# Patient Record
Sex: Female | Born: 1995 | Hispanic: No | Marital: Single | Smoking: Never smoker
Health system: Southern US, Community
[De-identification: ages and names within clinical notes are randomized; demographics above are authoritative.]

## PROBLEM LIST (undated history)

## (undated) DIAGNOSIS — D649 Anemia, unspecified: Secondary | ICD-10-CM

## (undated) DIAGNOSIS — F419 Anxiety disorder, unspecified: Secondary | ICD-10-CM

## (undated) DIAGNOSIS — R87629 Unspecified abnormal cytological findings in specimens from vagina: Secondary | ICD-10-CM

## (undated) HISTORY — PX: OVARIAN CYST REMOVAL: SHX89

## (undated) HISTORY — DX: Anemia, unspecified: D64.9

## (undated) HISTORY — DX: Unspecified abnormal cytological findings in specimens from vagina: R87.629

---
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
## 2020-05-29 ENCOUNTER — Ambulatory Visit: Payer: Self-pay | Admitting: Physician Assistant

## 2020-05-29 ENCOUNTER — Other Ambulatory Visit: Payer: Self-pay

## 2020-05-29 DIAGNOSIS — N760 Acute vaginitis: Secondary | ICD-10-CM

## 2020-05-29 DIAGNOSIS — B9689 Other specified bacterial agents as the cause of diseases classified elsewhere: Secondary | ICD-10-CM

## 2020-05-29 DIAGNOSIS — Z113 Encounter for screening for infections with a predominantly sexual mode of transmission: Secondary | ICD-10-CM

## 2020-05-29 LAB — WET PREP FOR TRICH, YEAST, CLUE
Trichomonas Exam: NEGATIVE
Yeast Exam: NEGATIVE

## 2020-05-29 MED ORDER — METRONIDAZOLE 500 MG PO TABS: 500.0000 mg | ORAL_TABLET | Freq: Two times a day (BID) | ORAL | 0 refills | Status: AC

## 2020-05-30 ENCOUNTER — Encounter: Payer: Self-pay | Admitting: Physician Assistant

## 2020-05-30 NOTE — Progress Notes (Signed)
  [facility] Department STI clinic/screening visit  Subjective:  Jane Padilla is a 24 y.o. female being seen today for an STI screening visit. The patient reports they do have symptoms.  Patient reports that they do not desire a pregnancy in the next year.   They reported they are not interested in discussing contraception today.  No LMP recorded.   Patient has the following medical conditions:  There are no problems to display for this patient.   Chief Complaint  Patient presents with  . SEXUALLY TRANSMITTED DISEASE    screeniing    HPI  Patient reports that she has had an increase in the amount of vaginal discharge and an odor for 2 weeks.  Denies other symptoms and chronic conditions.  Reports surgery on ovaries in 2012, last pap 2020 and last HIV test in 2020.  States that she is using Depo for [facility] and has occasional spotting with this method.  States last injection was 2 days ago.     See flowsheet for further details and programmatic requirements.    The following portions of the patient's history were reviewed and updated as appropriate: allergies, current medications, past medical history, past social history, past surgical history and problem list.  Objective:  There were no vitals filed for this visit.  Physical Exam Constitutional:      General: She is not in acute distress.    Appearance: Normal appearance.  HENT:     Head: Normocephalic and atraumatic.     Comments: No nits, lice or hair loss. No cervical, supraclavicular or axillary adenopathy.    Mouth/Throat:     Mouth: Mucous membranes are moist.     Pharynx: Oropharynx is clear. No oropharyngeal exudate or posterior oropharyngeal erythema.  Eyes:     Conjunctiva/sclera: Conjunctivae normal.  Pulmonary:     Effort: Pulmonary effort is normal.  Skin:    General: Skin is warm and dry.  Neurological:     Mental Status: She is alert and oriented to person, place, and time.  Psychiatric:         Mood and Affect: Mood normal.        Behavior: Behavior normal.        Thought Content: Thought content normal.        Judgment: Judgment normal.    Patient requested to self-collect vaginal samples today.    Assessment and Plan:  Jane Padilla is a 24 y.o. female presenting to the [facility] Department for STI screening  1. Screening for STD (sexually transmitted disease) Patient into clinic with symptoms.  Declines blood work today. Rec condoms with all sex. Await test results.  Counseled that RN will call if needs to RTC for further treatment once results are back.  - WET PREP FOR TRICH, YEAST, CLUE - Chlamydia/Gonorrhea [facility]  2. BV (bacterial vaginosis) Will treat for BV with Metronidazole 500mg  #14 1 po BID for 7 days with food, no EtOH for 24 hr before and until 72 hr after completing medicine. No sex for 7 days. Rec that she use OTC antifungal cream if has itching during or just after treatment with antibiotic. - metroNIDAZOLE (FLAGYL) 500 MG tablet; Take 1 tablet (500 mg total) by mouth 2 (two) times daily for 7 days.  Dispense: 14 tablet; Refill: 0     No follow-ups on file.  No future appointments.  , PA

## 2020-06-02 NOTE — Progress Notes (Signed)
Chart reviewed by Pharmacist  Suzanne Walker PharmD, Contract Pharmacist at [facility]  

## 2021-06-27 ENCOUNTER — Encounter (HOSPITAL_COMMUNITY): Payer: Self-pay

## 2021-06-27 ENCOUNTER — Emergency Department (HOSPITAL_COMMUNITY)
Admission: EM | Admit: 2021-06-27 | Discharge: 2021-06-27 | Disposition: A | Discharge status: Home / Self Care | Payer: 59 | Attending: Emergency Medicine | Admitting: Emergency Medicine

## 2021-06-27 ENCOUNTER — Emergency Department (HOSPITAL_COMMUNITY): Payer: 59

## 2021-06-27 DIAGNOSIS — R1013 Epigastric pain: Secondary | ICD-10-CM | POA: Diagnosis present

## 2021-06-27 DIAGNOSIS — R0602 Shortness of breath: Secondary | ICD-10-CM | POA: Diagnosis not present

## 2021-06-27 DIAGNOSIS — R55 Syncope and collapse: Secondary | ICD-10-CM | POA: Insufficient documentation

## 2021-06-27 DIAGNOSIS — R002 Palpitations: Secondary | ICD-10-CM | POA: Diagnosis not present

## 2021-06-27 DIAGNOSIS — R0789 Other chest pain: Secondary | ICD-10-CM | POA: Diagnosis not present

## 2021-06-27 DIAGNOSIS — R202 Paresthesia of skin: Secondary | ICD-10-CM | POA: Diagnosis not present

## 2021-06-27 DIAGNOSIS — R110 Nausea: Secondary | ICD-10-CM | POA: Insufficient documentation

## 2021-06-27 LAB — CBC WITH DIFFERENTIAL/PLATELET
Abs Immature Granulocytes: 0 10*3/uL (ref 0.00–0.07)
Basophils Absolute: 0 10*3/uL (ref 0.0–0.1)
Basophils Relative: 0 %
Eosinophils Absolute: 0 10*3/uL (ref 0.0–0.5)
Eosinophils Relative: 0 %
HCT: 37.3 % (ref 36.0–46.0)
Hemoglobin: 12.7 g/dL (ref 12.0–15.0)
Immature Granulocytes: 0 %
Lymphocytes Relative: 31 %
Lymphs Abs: 1.5 10*3/uL (ref 0.7–4.0)
MCH: 29.6 pg (ref 26.0–34.0)
MCHC: 34 g/dL (ref 30.0–36.0)
MCV: 86.9 fL (ref 80.0–100.0)
Monocytes Absolute: 0.5 10*3/uL (ref 0.1–1.0)
Monocytes Relative: 11 %
Neutro Abs: 2.7 10*3/uL (ref 1.7–7.7)
Neutrophils Relative %: 58 %
Platelets: 245 10*3/uL (ref 150–400)
RBC: 4.29 MIL/uL (ref 3.87–5.11)
RDW: 13.1 % (ref 11.5–15.5)
WBC: 4.7 10*3/uL (ref 4.0–10.5)
nRBC: 0 % (ref 0.0–0.2)

## 2021-06-27 LAB — COMPREHENSIVE METABOLIC PANEL
ALT: 27 U/L (ref 0–44)
AST: 29 U/L (ref 15–41)
Albumin: 4.1 g/dL (ref 3.5–5.0)
Alkaline Phosphatase: 84 U/L (ref 38–126)
Anion gap: 6 (ref 5–15)
BUN: 7 mg/dL (ref 6–20)
CO2: 25 mmol/L (ref 22–32)
Calcium: 9.3 mg/dL (ref 8.9–10.3)
Chloride: 107 mmol/L (ref 98–111)
Creatinine, Ser: 0.89 mg/dL (ref 0.44–1.00)
GFR, Estimated: >60 mL/min (ref >60)
Glucose, Bld: 86 mg/dL (ref 70–99)
Potassium: 4 mmol/L (ref 3.5–5.1)
Sodium: 138 mmol/L (ref 135–145)
Total Bilirubin: 1.2 mg/dL (ref 0.3–1.2)
Total Protein: 7.7 g/dL (ref 6.5–8.1)

## 2021-06-27 LAB — HCG, QUANTITATIVE, PREGNANCY: hCG, Beta Chain, Quant, S: <1 m[IU]/mL (ref <5)

## 2021-06-27 LAB — TROPONIN I (HIGH SENSITIVITY): Troponin I (High Sensitivity): <2 ng/L (ref <18)

## 2021-06-27 NOTE — ED Triage Notes (Signed)
Pt arrived via POV, states "heart hurts" intermittently, with some associated abd pain and back pain.

## 2021-06-27 NOTE — ED Provider Notes (Signed)
Emergency Medicine Provider Triage Evaluation Note  Jane Padilla , a 25 y.o. female  was evaluated in triage.  Pt complains of chest pain, abdominal pain, back pain.  Symptoms began approximately 1 hours ago after drinking herbalife tea.  States that at baseline her "heart is aching all the time."  She was referred to a cardiologist in the past but never ended up seeing them due to insurance issues.  Felt like her left arm went limp as well.  Had an episode of crying at home after this happened.  Feels like her symptoms have subsided but were hard to ignore.  Review of Systems  Positive: Chest pain, abdominal pain, back Negative: Cough  Physical Exam  BP 113/78 (BP Location: Left Arm)   Pulse 70   Resp 16   LMP 05/30/2021   SpO2 100%  Gen:   Awake, no distress   Resp:  Normal effort  MSK:   Moves extremities without difficulty  Other:  Breath sounds normal bilaterally.  Strength 5/5 in bilateral upper extremities  Medical Decision Making  Medically screening exam initiated at 1:12 PM.  Appropriate orders placed.  Clotine Brandon was informed that the remainder of the evaluation will be completed by another provider, this initial triage assessment does not replace that evaluation, and the importance of remaining in the ED until their evaluation is complete.  Lab work and EKG ordered   Dietrich Pates, PA-C 06/27/21 1313    Derwood Kaplan, MD 06/27/21 1725

## 2021-06-27 NOTE — Discharge Instructions (Signed)
Please read and follow all provided instructions.  Your diagnoses today include:  1. Epigastric pain   2. Palpitations     Tests performed today include: An EKG of your heart A chest x-ray Cardiac enzymes - a blood test for heart muscle damage Blood counts and electrolytes Vital signs. See below for your results today.   Medications prescribed:   Take any prescribed medications only as directed.  Follow-up instructions: Please follow-up with your primary care provider as soon as you can for further evaluation of your symptoms.   Return instructions:  SEEK IMMEDIATE MEDICAL ATTENTION IF: You have severe chest pain, especially if the pain is crushing or pressure-like and spreads to the arms, back, neck, or jaw, or if you have sweating, nausea (feeling sick to your stomach), or shortness of breath. THIS IS AN EMERGENCY. Don't wait to see if the pain will go away. Get medical help at once. Call 911 or 0 (operator). DO NOT drive yourself to the hospital.  Your chest pain gets worse and does not go away with rest.  You have an attack of chest pain lasting longer than usual, despite rest and treatment with the medications your caregiver has prescribed.  You wake from sleep with chest pain or shortness of breath. You feel dizzy or faint. You have chest pain not typical of your usual pain for which you originally saw your caregiver.  You have any other emergent concerns regarding your health.  Additional Information: Chest pain comes from many different causes. Your caregiver has diagnosed you as having chest pain that is not specific for one problem, but does not require admission.  You are at low risk for an acute heart condition or other serious illness.   Your vital signs today were: BP 117/71 (BP Location: Left Arm)   Pulse 66   Temp 98.4 F (36.9 C)   Resp 18   LMP 05/30/2021   SpO2 100%  If your blood pressure (BP) was elevated above 135/85 this visit, please have this  repeated by your doctor within one month. --------------

## 2021-06-27 NOTE — ED Provider Notes (Signed)
[facility]-EMERGENCY DEPT Provider Note   CSN: [id number] Arrival date & time: 06/27/21  1245     History Chief Complaint  Patient presents with   Palpitations    Jane Padilla is a 25 y.o. female.  Patient with no significant past medical history but reports intermittent palpitations, sometimes with near syncope, intermittent epigastric and chest pain, some occasional paresthesias, mainly in her right arm over the past several months --presents for evaluation of epigastric pain today with associated paresthesias and palpitations.  Patient states that she has been using a new herbalife supplement over the past week.  Today she took one of the supplements with a supplement tea and right after drinking this she developed a pain in her epigastrium which radiated up to her chest.  Symptoms gradually got worse.  At that time she had shortness of breath, aching pain in her chest, palpitations described as a fast heart rate.  She did not pass out.  She also had paresthesias in her left arm.  She denies associated fevers or cough.  She had nausea but no vomiting or diarrhea.  No urinary symptoms.  Patient denies risk factors for pulmonary embolism including: unilateral leg swelling, history of DVT/PE/other blood clots, use of exogenous hormones, recent immobilizations, recent surgery, recent travel (>4hr segment), malignancy, hemoptysis.  Symptoms have since improved and resolved.  In the past she was referred to a cardiologist but could not follow-up at that time.  No significant family of heart disease however father has had some abnormal heart rhythms and had to wear a Holter monitor and was told to avoid caffeine.  She denies other stimulants or drug use.  She does not use caffeine.      History reviewed. No pertinent past medical history.  There are no problems to display for this patient.   History reviewed. No pertinent surgical history.   OB History   No  obstetric history on file.     History reviewed. No pertinent family history.  Social History   Tobacco Use   Smoking status: Never   Smokeless tobacco: Never  Substance Use Topics   Alcohol use: Yes    Comment: socially   Drug use: Never    Home Medications Prior to Admission medications   Not on File    Allergies    Red dye  Review of Systems   Review of Systems  Constitutional:  Negative for diaphoresis and fever.  Eyes:  Negative for redness.  Respiratory:  Positive for shortness of breath. Negative for cough.   Cardiovascular:  Positive for chest pain and palpitations. Negative for leg swelling.  Gastrointestinal:  Positive for abdominal pain and nausea. Negative for vomiting.  Genitourinary:  Negative for dysuria.  Musculoskeletal:  Negative for back pain and neck pain.  Skin:  Negative for rash.  Neurological:  Positive for numbness (Paresthesias). Negative for syncope and light-headedness.  Psychiatric/Behavioral:  The patient is nervous/anxious.    Physical Exam Updated Vital Signs BP 117/71 (BP Location: Left Arm)   Pulse 66   Temp 98.4 F (36.9 C)   Resp 18   LMP 05/30/2021   SpO2 100%   Physical Exam Vitals and nursing note reviewed.  Constitutional:      Appearance: She is well-developed. She is not diaphoretic.  HENT:     Head: Normocephalic and atraumatic.     Mouth/Throat:     Mouth: Mucous membranes are not dry.  Eyes:     Conjunctiva/sclera: Conjunctivae normal.  Neck:     Vascular: Normal carotid pulses. No JVD.     Trachea: Trachea normal. No tracheal deviation.  Cardiovascular:     Rate and Rhythm: Normal rate and regular rhythm.     Pulses: No decreased pulses.          Radial pulses are 2+ on the right side and 2+ on the left side.     Heart sounds: Normal heart sounds, S1 normal and S2 normal. No murmur heard. Pulmonary:     Effort: Pulmonary effort is normal. No respiratory distress.     Breath sounds: No wheezing.  Chest:      Chest wall: No tenderness.  Abdominal:     General: Bowel sounds are normal.     Palpations: Abdomen is soft.     Tenderness: There is no abdominal tenderness. There is no guarding or rebound.  Musculoskeletal:        General: Normal range of motion.     Cervical back: Normal range of motion and neck supple. No muscular tenderness.  Skin:    General: Skin is warm and dry.     Coloration: Skin is not pale.  Neurological:     Mental Status: She is alert.    ED Results / Procedures / Treatments   Labs (all labs ordered are listed, but only abnormal results are displayed) Labs Reviewed  CBC WITH DIFFERENTIAL/PLATELET  COMPREHENSIVE METABOLIC PANEL  HCG, QUANTITATIVE, PREGNANCY  I-STAT BETA HCG BLOOD, ED ([facility], [facility], [facility] ONLY)  TROPONIN I (HIGH SENSITIVITY)    ED ECG REPORT   Date: 06/27/2021  Rate: 71  Rhythm: normal sinus rhythm  QRS Axis: normal  Intervals: normal  ST/T Wave abnormalities: nonspecific T wave changes  Conduction Disutrbances:none  Narrative Interpretation: Patient with flattened T waves in V3 and V4  Old EKG Reviewed: none available  I have personally reviewed the EKG tracing and agree with the computerized printout as noted.   Radiology DG Chest 2 View  Result Date: 06/27/2021 CLINICAL DATA:  Chest pain and shortness of breath for several hours EXAM: CHEST - 2 VIEW COMPARISON:  None. FINDINGS: The heart size and mediastinal contours are within normal limits. Both lungs are clear. The visualized skeletal structures are unremarkable. IMPRESSION: No active cardiopulmonary disease. Electronically Signed   By: Alcide Clever M.D.   On: 06/27/2021 14:25    Procedures Procedures   Medications Ordered in ED Medications - No data to display  ED Course  I have reviewed the triage vital signs and the nursing notes.  Pertinent labs & imaging results that were available during my care of the patient were reviewed by me and considered in my medical decision making  (see chart for details).  Patient seen and examined.  Triage work-up reviewed.  Reviewed EKG which overall does not appear to be concerning.  No signs of prolonged QT, WPW, Brugada syndrome, arrhythmia, heart block etc.  Vital signs reviewed and are as follows: BP 117/71 (BP Location: Left Arm)   Pulse 66   Temp 98.9 F (37.2 C) (Oral)   Resp 18   LMP 05/30/2021   SpO2 100%   I had a good discussion with the patient and mother at bedside.  Today's symptoms seem to be likely GI related since it occurred shortly after taking a supplement.  This does not sound like an anaphylactic or allergic reaction.  Possible GI spasm.  I think her subsequent symptoms were related to an appropriate stress response to the pain  that she was having.  It is reassuring that her symptoms gradually resolved.  Her work-up today looks great.  In regards to her intermittent palpitations and chest discomfort which have been occurring prior to today, etiology is more unclear.  I think she would benefit from cardiology evaluation for consideration of Holter monitor, possibly echocardiogram.  Cannot rule out tachyarrhythmia.  Discussed care with taking supplements in the future.  Patient was counseled to return with severe chest pain, especially if the pain is crushing or pressure-like and spreads to the arms, back, neck, or jaw, or if they have sweating, nausea, or shortness of breath with the pain. They were encouraged to call 911 with these symptoms.   The patient verbalized understanding and agreed.     MDM Rules/Calculators/A&P                          Patient with normal work-up today.  Doubt PE, ACS.  Discussion as above.  Patient looks well and symptoms resolved.  Vital signs are reassuring.  No dangerous or life-threatening conditions suspected or identified by history, physical exam, and by work-up. No indications for hospitalization identified.    Final Clinical Impression(s) / ED Diagnoses Final  diagnoses:  Epigastric pain  Palpitations    Rx / DC Orders ED Discharge Orders     None        Renne Crigler, PA-C 06/27/21 1603    Maia Plan, MD 06/27/21 [phone]

## 2021-09-16 DIAGNOSIS — K581 Irritable bowel syndrome with constipation: Secondary | ICD-10-CM | POA: Insufficient documentation

## 2021-09-16 DIAGNOSIS — L719 Rosacea, unspecified: Secondary | ICD-10-CM | POA: Insufficient documentation

## 2022-07-16 IMAGING — CR DG CHEST 2V
2 series · 2 of 2 positions shown · non-contrast
Comparison: None.

CLINICAL DATA: Chest pain and shortness of breath for several hours

EXAM:
CHEST - 2 VIEW

[w chest pa]
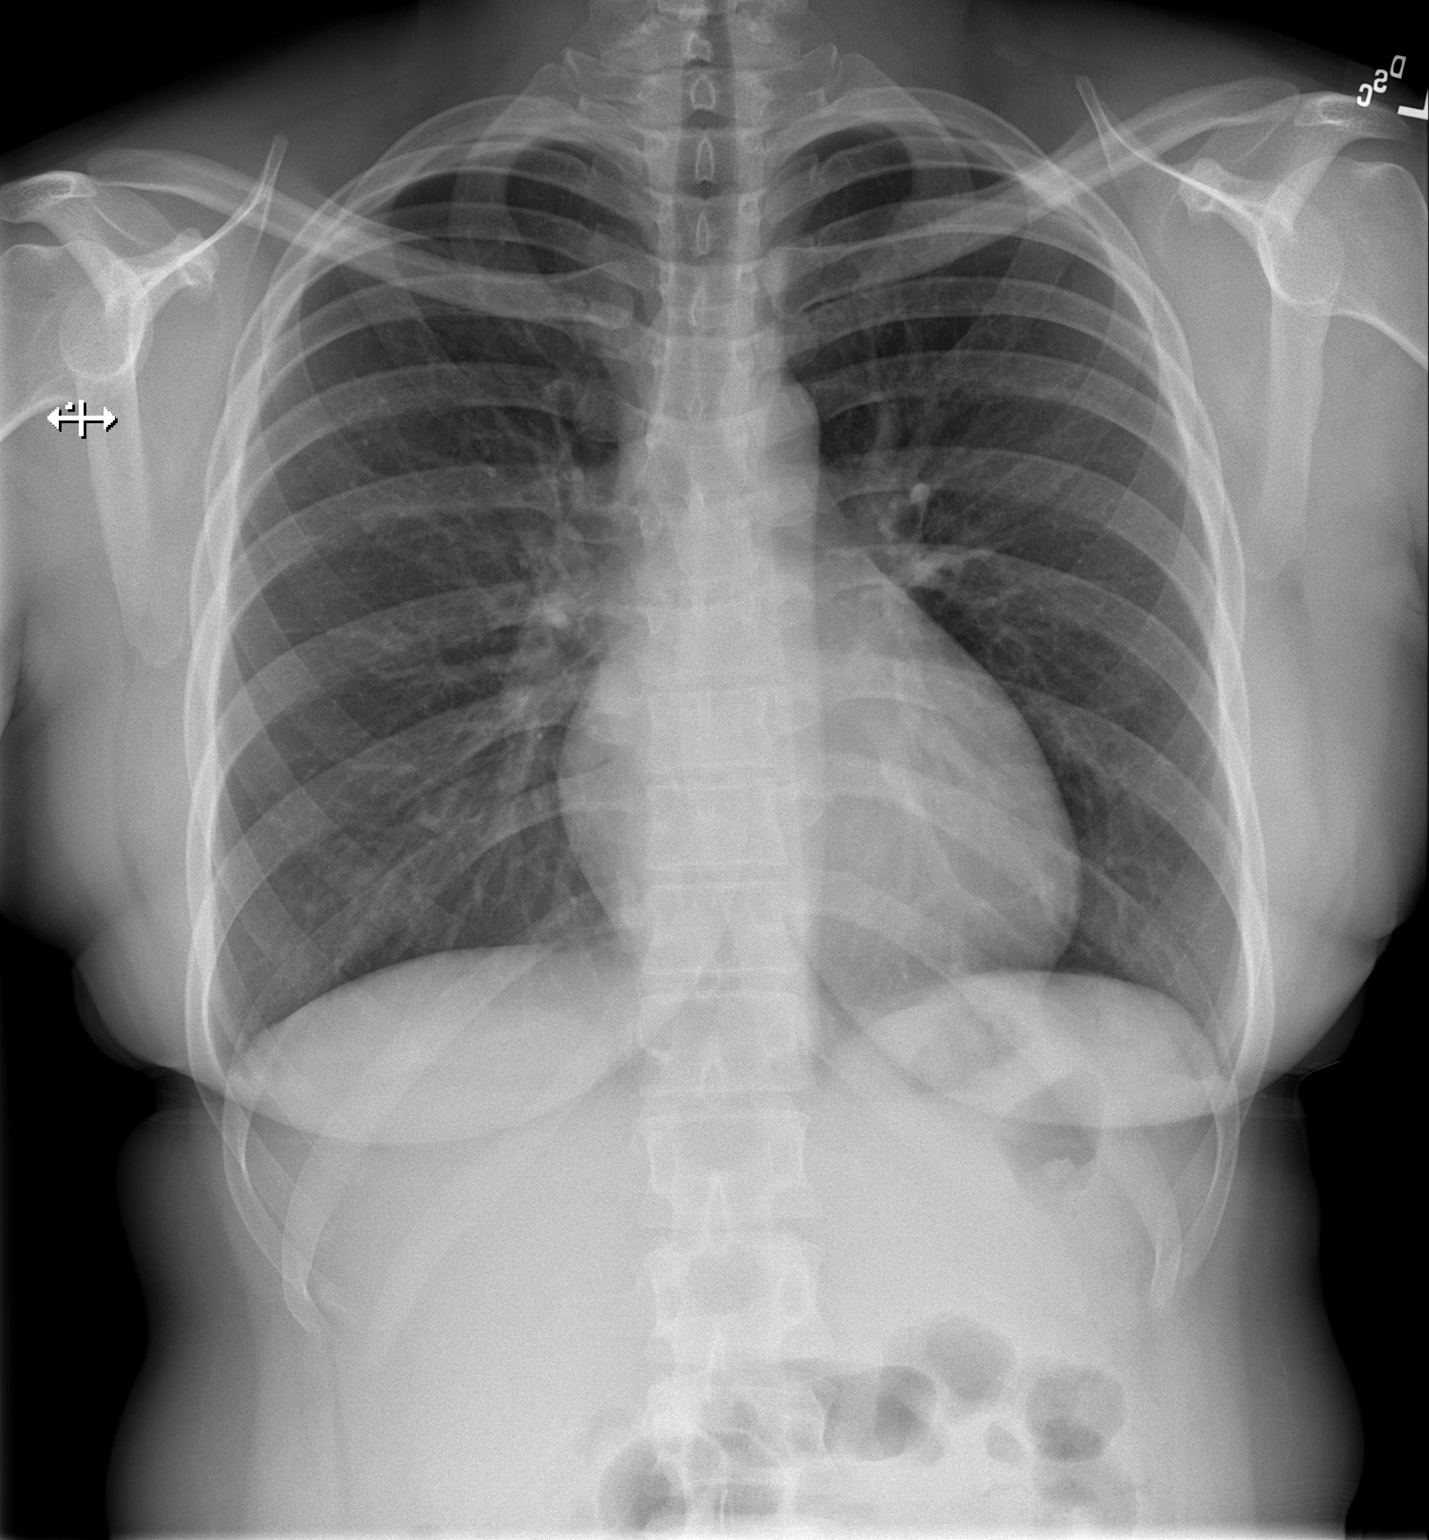

[w chest lat]
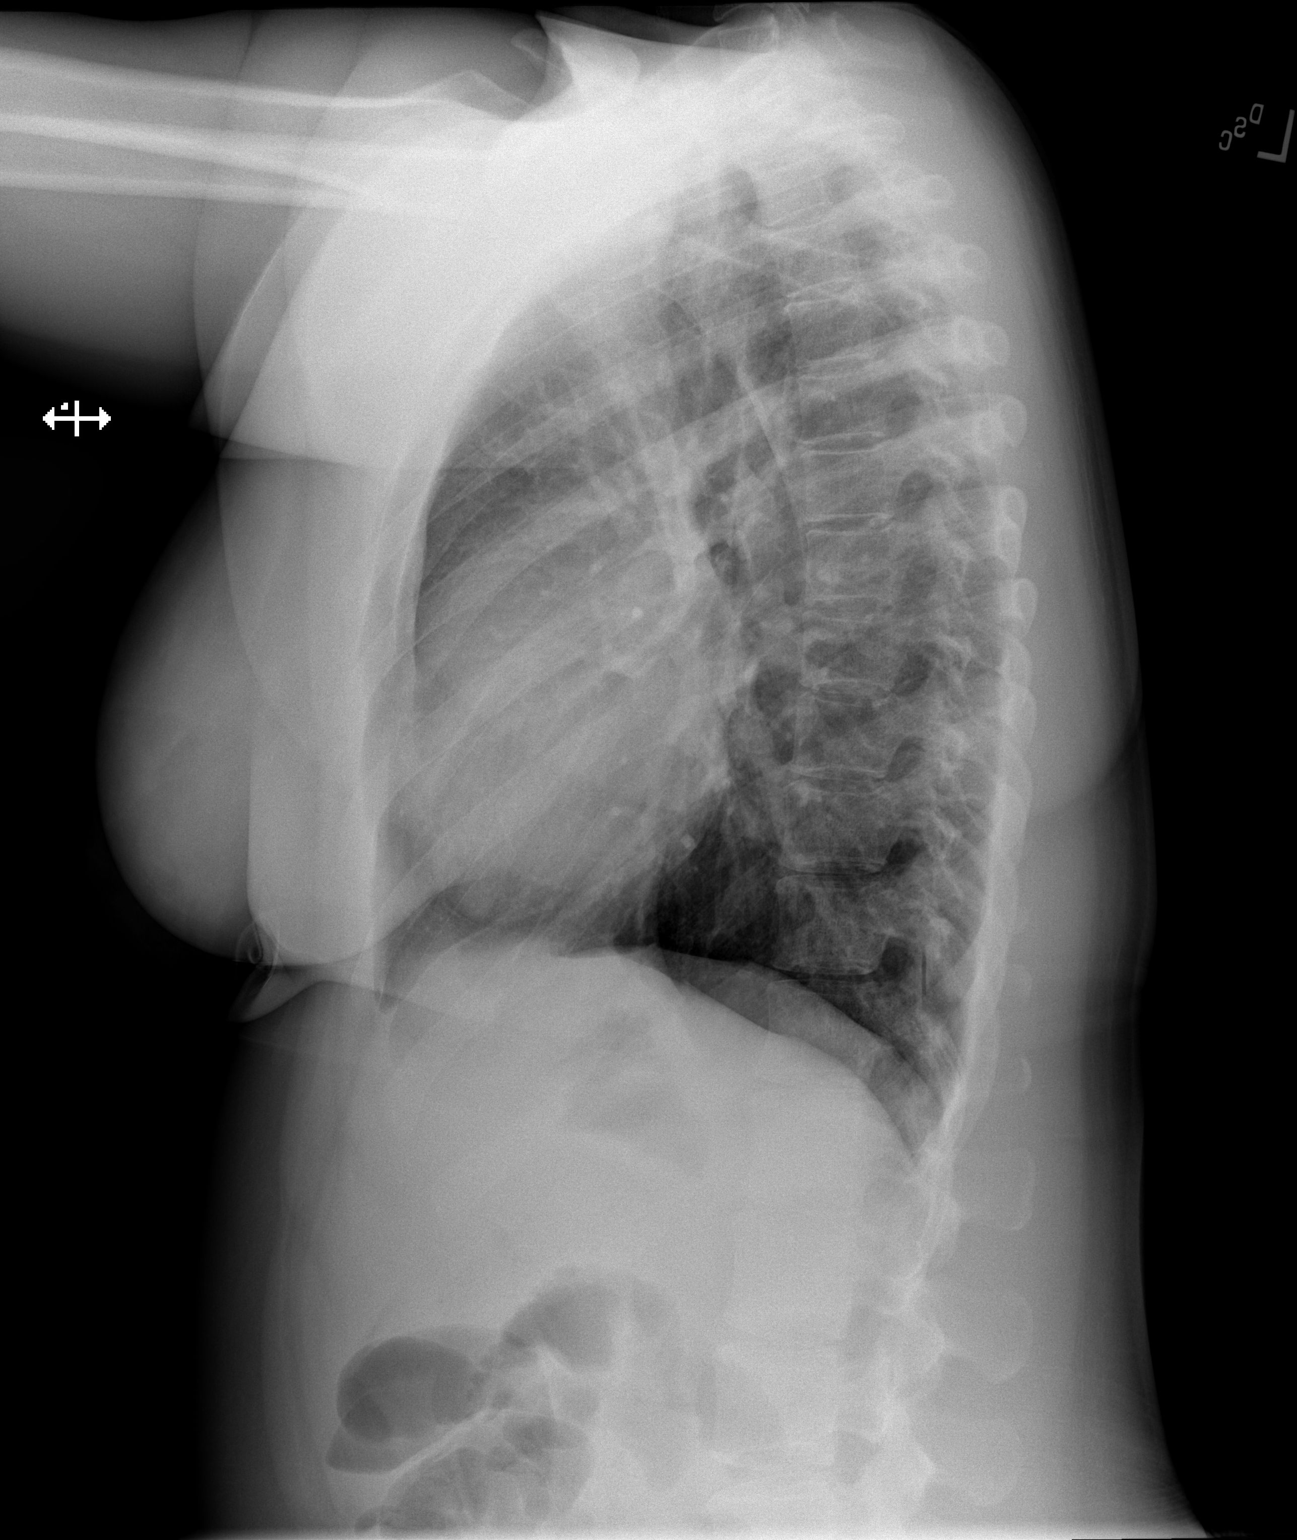

[2 of 2 positions shown; findings below may reference images not displayed]

FINDINGS: The heart size and mediastinal contours are within normal limits.
Both lungs are clear. The visualized skeletal structures are
unremarkable.
IMPRESSION: No active cardiopulmonary disease.

## 2024-02-10 ENCOUNTER — Other Ambulatory Visit: Payer: Self-pay

## 2024-02-10 ENCOUNTER — Ambulatory Visit (INDEPENDENT_AMBULATORY_CARE_PROVIDER_SITE_OTHER): Payer: Self-pay | Admitting: Family Medicine

## 2024-02-10 ENCOUNTER — Encounter: Payer: Self-pay | Admitting: Family Medicine

## 2024-02-10 VITALS — BP 117/73 | HR 96 | Wt 174.0 lb

## 2024-02-10 DIAGNOSIS — Z3480 Encounter for supervision of other normal pregnancy, unspecified trimester: Secondary | ICD-10-CM | POA: Insufficient documentation

## 2024-02-10 DIAGNOSIS — Z3490 Encounter for supervision of normal pregnancy, unspecified, unspecified trimester: Secondary | ICD-10-CM | POA: Insufficient documentation

## 2024-02-10 DIAGNOSIS — B009 Herpesviral infection, unspecified: Secondary | ICD-10-CM | POA: Insufficient documentation

## 2024-02-10 DIAGNOSIS — Z3A19 19 weeks gestation of pregnancy: Secondary | ICD-10-CM

## 2024-02-10 DIAGNOSIS — D573 Sickle-cell trait: Secondary | ICD-10-CM

## 2024-02-10 DIAGNOSIS — O219 Vomiting of pregnancy, unspecified: Secondary | ICD-10-CM

## 2024-02-10 MED ORDER — ONDANSETRON 4 MG PO TBDP: 4.0000 mg | ORAL_TABLET | Freq: Three times a day (TID) | ORAL | 2 refills | Status: DC | PRN

## 2024-02-10 NOTE — Progress Notes (Signed)
   PRENATAL VISIT NOTE  Subjective:  Jane Padilla is a 28 y.o. G1P0 at [redacted]w[redacted]d being seen today for transferring prenatal care from [facility]. PN records reviewed. Do not have all labs. Per her reports, normal anatomy and AFP yesterday. 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  She is currently monitored for the following issues for this low-risk pregnancy and has Supervision of other normal pregnancy, antepartum and Sickle cell trait (HCC) on their problem list.  Patient reports no complaints.  Contractions: Not present. Vag. Bleeding: None.  Movement: Present. Denies leaking of fluid.   The following portions of the patient's history were reviewed and updated as appropriate: allergies, current medications, past family history, past medical history, past social history, past surgical history and problem list.   Objective:   Vitals:   02/10/24 1026  BP: 117/73  Pulse: 96  Weight: 174 lb (78.9 kg)    Fetal Status: Fetal Heart Rate (bpm): 158   Movement: Present     General:  Alert, oriented and cooperative. Patient is in no acute distress.  Skin: Skin is warm and dry. No rash noted.   Cardiovascular: Normal heart rate noted  Respiratory: Normal respiratory effort, no problems with respiration noted  Abdomen: Soft, gravid, appropriate for gestational age.  Pain/Pressure: Absent     Pelvic: Cervical exam deferred        Extremities: Normal range of motion.  Edema: None  Mental Status: Normal mood and affect. Normal behavior. Normal judgment and thought content.   Assessment and Plan:  Pregnancy: G1P0 at [redacted]w[redacted]d 1. Supervision of other normal pregnancy, antepartum (Primary) Moving here in 02/2024. Will need 28 week labs after that.  2. Sickle cell trait (HCC) Offered partner testing, he reports being tested as a baby and being negative.  3. Nausea/vomiting in pregnancy Refilled Zofran Using Diclegis - ondansetron (ZOFRAN-ODT) 4 MG disintegrating tablet; Take 1 tablet (4 mg total) by  mouth every 8 (eight) hours as needed for nausea or vomiting.  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 [redacted] weeks gestation of pregnancy   General obstetric precautions including but not limited to vaginal bleeding, contractions, leaking of fluid and fetal movement were reviewed in detail with the patient. Please refer to After Visit Summary for other counseling recommendations.   Return in 4 weeks (on 03/09/2024).  Future Appointments  Date Time Provider Department Center  03/27/2024  1:15 PM Sue Lush, FNP [facility]    Reva Bores, MD

## 2024-03-27 ENCOUNTER — Ambulatory Visit (INDEPENDENT_AMBULATORY_CARE_PROVIDER_SITE_OTHER): Payer: Medicaid Other | Admitting: Obstetrics and Gynecology

## 2024-03-27 ENCOUNTER — Other Ambulatory Visit: Payer: Self-pay

## 2024-03-27 VITALS — BP 107/75 | HR 86 | Wt 189.7 lb

## 2024-03-27 DIAGNOSIS — Z3480 Encounter for supervision of other normal pregnancy, unspecified trimester: Secondary | ICD-10-CM

## 2024-03-27 DIAGNOSIS — Z3A26 26 weeks gestation of pregnancy: Secondary | ICD-10-CM

## 2024-03-27 DIAGNOSIS — B009 Herpesviral infection, unspecified: Secondary | ICD-10-CM

## 2024-03-27 DIAGNOSIS — O26842 Uterine size-date discrepancy, second trimester: Secondary | ICD-10-CM

## 2024-03-27 DIAGNOSIS — D573 Sickle-cell trait: Secondary | ICD-10-CM

## 2024-03-27 NOTE — Progress Notes (Signed)
   PRENATAL VISIT NOTE  Subjective:  Jane Padilla is a 28 y.o. G1P0 at [redacted]w[redacted]d being seen today for ongoing prenatal care.  She is currently monitored for the following issues for this low-risk pregnancy and has Supervision of other normal pregnancy, antepartum; Sickle cell trait (HCC); Irritable bowel syndrome with constipation; Migraines; Ovarian torsion; Rosacea; and HSV infection on their problem list.  Patient reports    .   .  .   . Denies leaking of fluid.   The following portions of the patient's history were reviewed and updated as appropriate: allergies, current medications, past family history, past medical history, past social history, past surgical history and problem list.   Objective:   Vitals:   03/27/24 1324  BP: 107/75  Pulse: 86  Weight: 189 lb 11.2 oz (86 kg)    Fetal Status:   Fundal Height: 29 cm       General:  Alert, oriented and cooperative. Patient is in no acute distress.  Skin: Skin is warm and dry. No rash noted.   Cardiovascular: Normal heart rate noted  Respiratory: Normal respiratory effort, no problems with respiration noted  Abdomen: Soft, gravid, appropriate for gestational age.        Pelvic: Cervical exam deferred        Extremities: Normal range of motion.     Mental Status: Normal mood and affect. Normal behavior. Normal judgment and thought content.   Assessment and Plan:  Pregnancy: G1P0 at [redacted]w[redacted]d 1. Supervision of other normal pregnancy, antepartum (Primary) BP and FHR normal Doing well, feeling regular movement    2. Sickle cell trait (HCC) Partner reports negative   3. HSV infection Ppx 34/36 weeks  4. [redacted] weeks gestation of pregnancy Anticipatory guidance regarding GTT and labs next visit, discussed NPO status after midnight  [facility] has requested records from [facility] in [place]  5. Uterine size-date discrepancy in second trimester Normal anatomy scan 2/12 at [facility] [place],  Follow up growth scheduled   - [place] MFM OB COMP + 14 WK; Future    Preterm labor symptoms and general obstetric precautions including but not limited to vaginal bleeding, contractions, leaking of fluid and fetal movement were reviewed in detail with the patient. Please refer to After Visit Summary for other counseling recommendations.   Future Appointments  Date Time Provider Department Center  04/10/2024  8:20 AM [facility]  04/10/2024  8:55 AM Christean Leaf [facility]  04/17/2024  8:00 AM [facility] NURSE INTAKE [facility]  04/19/2024  7:00 AM [facility] PROVIDER 1 [facility]  04/19/2024  7:30 AM [facility] US6 [facility]    Albertine Grates, FNP

## 2024-03-27 NOTE — Patient Instructions (Signed)

## 2024-04-09 NOTE — Progress Notes (Unsigned)
   PRENATAL VISIT NOTE  Subjective:  Jane Padilla is a 28 y.o. G1P0 at [redacted]w[redacted]d being seen today for ongoing prenatal care.  She is currently monitored for the following issues for this {Blank single:19197::"high-risk","low-risk"} pregnancy and has Supervision of other normal pregnancy, antepartum; Sickle cell trait (HCC); Irritable bowel syndrome with constipation; Migraines; Ovarian torsion; Rosacea; and HSV infection on their problem list.  Patient reports {sx:14538}.   .  .   . Denies leaking of fluid.   The following portions of the patient's history were reviewed and updated as appropriate: allergies, current medications, past family history, past medical history, past social history, past surgical history and problem list.   Objective:  There were no vitals filed for this visit.  Fetal Status:           General:  Alert, oriented and cooperative. Patient is in no acute distress.  Skin: Skin is warm and dry. No rash noted.   Cardiovascular: Normal heart rate noted  Respiratory: Normal respiratory effort, no problems with respiration noted  Abdomen: Soft, gravid, appropriate for gestational age.        Pelvic: {Blank single:19197::"Cervical exam performed in the presence of a chaperone","Cervical exam deferred"}        Extremities: Normal range of motion.     Mental Status: Normal mood and affect. Normal behavior. Normal judgment and thought content.   Assessment and Plan:  Pregnancy: G1P0 at [redacted]w[redacted]d 1. Supervision of other normal pregnancy, antepartum (Primary) Patient is doing well, feeling regular fetal movent  BP, FHR, FH appropriate   2. [redacted] weeks gestation of pregnancy Anticipatory guidance about next visits/weeks of pregnancy given.   Preterm labor symptoms and general obstetric precautions including but not limited to vaginal bleeding, contractions, leaking of fluid and fetal movement were reviewed in detail with the patient.  Please refer to After Visit Summary for  other counseling recommendations.   No follow-ups on file.  Future Appointments  Date Time Provider Department Center  04/10/2024  8:20 AM [facility]  04/10/2024  8:55 AM Jeralyn Nolden E, PA-C [facility]  04/17/2024  8:00 AM [facility] NURSE INTAKE [facility]  04/19/2024  7:00 AM [facility] PROVIDER 1 [facility]  04/19/2024  7:30 AM [facility] US6 [facility]    Luevenia Saha, PA-C

## 2024-04-10 ENCOUNTER — Ambulatory Visit (INDEPENDENT_AMBULATORY_CARE_PROVIDER_SITE_OTHER): Payer: Self-pay | Admitting: Physician Assistant

## 2024-04-10 ENCOUNTER — Other Ambulatory Visit: Payer: Self-pay

## 2024-04-10 ENCOUNTER — Other Ambulatory Visit

## 2024-04-10 VITALS — BP 120/79 | HR 88 | Wt 195.7 lb

## 2024-04-10 DIAGNOSIS — Z3480 Encounter for supervision of other normal pregnancy, unspecified trimester: Secondary | ICD-10-CM

## 2024-04-10 DIAGNOSIS — Z3A28 28 weeks gestation of pregnancy: Secondary | ICD-10-CM

## 2024-04-10 DIAGNOSIS — Z3483 Encounter for supervision of other normal pregnancy, third trimester: Secondary | ICD-10-CM

## 2024-04-11 ENCOUNTER — Encounter: Payer: Self-pay | Admitting: Physician Assistant

## 2024-04-11 LAB — CBC
Hematocrit: 32.8 % — ABNORMAL LOW (ref 34.0–46.6)
Hemoglobin: 10.6 g/dL — ABNORMAL LOW (ref 11.1–15.9)
MCH: 28.1 pg (ref 26.6–33.0)
MCHC: 32.3 g/dL (ref 31.5–35.7)
MCV: 87 fL (ref 79–97)
Platelets: 221 10*3/uL (ref 150–450)
RBC: 3.77 x10E6/uL (ref 3.77–5.28)
RDW: 12.8 % (ref 11.7–15.4)
WBC: 9.5 10*3/uL (ref 3.4–10.8)

## 2024-04-11 LAB — HIV ANTIBODY (ROUTINE TESTING W REFLEX): HIV Screen 4th Generation wRfx: NONREACTIVE

## 2024-04-11 LAB — RPR: RPR Ser Ql: NONREACTIVE

## 2024-04-11 LAB — GLUCOSE TOLERANCE, 2 HOURS W/ 1HR
Glucose, 1 hour: 137 mg/dL (ref 70–179)
Glucose, 2 hour: 117 mg/dL (ref 70–152)
Glucose, Fasting: 79 mg/dL (ref 70–91)

## 2024-04-13 ENCOUNTER — Encounter: Payer: Self-pay | Admitting: Lactation Services

## 2024-04-17 ENCOUNTER — Ambulatory Visit

## 2024-04-19 ENCOUNTER — Ambulatory Visit (HOSPITAL_BASED_OUTPATIENT_CLINIC_OR_DEPARTMENT_OTHER): Payer: Self-pay | Admitting: Obstetrics

## 2024-04-19 ENCOUNTER — Ambulatory Visit: Payer: Self-pay | Attending: Obstetrics and Gynecology

## 2024-04-19 VITALS — BP 101/62 | HR 83

## 2024-04-19 DIAGNOSIS — Z3A29 29 weeks gestation of pregnancy: Secondary | ICD-10-CM

## 2024-04-19 DIAGNOSIS — O99213 Obesity complicating pregnancy, third trimester: Secondary | ICD-10-CM

## 2024-04-19 DIAGNOSIS — D573 Sickle-cell trait: Secondary | ICD-10-CM | POA: Diagnosis not present

## 2024-04-19 DIAGNOSIS — Z3480 Encounter for supervision of other normal pregnancy, unspecified trimester: Secondary | ICD-10-CM

## 2024-04-19 DIAGNOSIS — E669 Obesity, unspecified: Secondary | ICD-10-CM

## 2024-04-19 DIAGNOSIS — O26893 Other specified pregnancy related conditions, third trimester: Secondary | ICD-10-CM | POA: Insufficient documentation

## 2024-04-19 DIAGNOSIS — O26843 Uterine size-date discrepancy, third trimester: Secondary | ICD-10-CM

## 2024-04-19 DIAGNOSIS — O99013 Anemia complicating pregnancy, third trimester: Secondary | ICD-10-CM

## 2024-04-19 DIAGNOSIS — Z363 Encounter for antenatal screening for malformations: Secondary | ICD-10-CM | POA: Insufficient documentation

## 2024-04-19 DIAGNOSIS — Z148 Genetic carrier of other disease: Secondary | ICD-10-CM | POA: Insufficient documentation

## 2024-04-19 DIAGNOSIS — O26842 Uterine size-date discrepancy, second trimester: Secondary | ICD-10-CM

## 2024-04-19 DIAGNOSIS — Z3A26 26 weeks gestation of pregnancy: Secondary | ICD-10-CM

## 2024-04-19 DIAGNOSIS — L299 Pruritus, unspecified: Secondary | ICD-10-CM | POA: Insufficient documentation

## 2024-04-19 NOTE — Progress Notes (Signed)
 MFM Consult Note  Jane Padilla is currently at 29 weeks and 5 days.  She was seen as her fundal heights have been measuring larger than her gestational age.  The patient recently moved to [place] from [place] .  She reports that she had a normal fetal anatomy scan earlier in her pregnancy.  She also reports that she has been experiencing whole body itching especially in her arms and legs over the past few days.  She had a cell free DNA test earlier in her pregnancy which indicated a low risk for trisomy 70, 95, and 13. A female fetus is predicted.   On today's exam, the overall EFW of 3 pounds 3 ounces measures at the 39th percentile for her gestational age.    There was normal amniotic fluid noted with a total AFI of 15.84 cm.  There were no obvious fetal anomalies noted on today's ultrasound exam.  The patient was informed that anomalies may be missed due to technical limitations. If the fetus is in a suboptimal position or maternal habitus is increased, visualization of the fetus in the maternal uterus may be impaired.  Due to her complaints of whole body itching, total bile acids were ordered today to rule out cholestasis of pregnancy.  The patient was sent to the lab following today's ultrasound exam to have her blood drawn.    As the fetal growth is within normal limits, no further exams were scheduled in our office.  However, should she be diagnosed with cholestasis of pregnancy, we will continue to follow her with weekly fetal testing and growth ultrasounds.    The implications of cholestasis of pregnancy, should she be diagnosed with this condition, including the increased risk of stillbirth and and an indicated delivery at around 37 weeks was discussed.    The patient stated that all of her questions were answered today.  A total of 45 minutes was spent counseling and coordinating the care for this patient.  Greater than 50% of the time was spent in direct  face-to-face contact.

## 2024-04-21 LAB — BILE ACIDS, TOTAL: Bile Acids Total: 7.4 umol/L (ref 0.0–10.0)

## 2024-04-27 ENCOUNTER — Ambulatory Visit (INDEPENDENT_AMBULATORY_CARE_PROVIDER_SITE_OTHER): Payer: Self-pay | Admitting: Advanced Practice Midwife

## 2024-04-27 ENCOUNTER — Other Ambulatory Visit: Payer: Self-pay

## 2024-04-27 VITALS — BP 107/71 | HR 79 | Wt 197.7 lb

## 2024-04-27 DIAGNOSIS — Z23 Encounter for immunization: Secondary | ICD-10-CM

## 2024-04-27 DIAGNOSIS — Z3A30 30 weeks gestation of pregnancy: Secondary | ICD-10-CM

## 2024-04-27 DIAGNOSIS — Z3480 Encounter for supervision of other normal pregnancy, unspecified trimester: Secondary | ICD-10-CM

## 2024-04-27 DIAGNOSIS — O99719 Diseases of the skin and subcutaneous tissue complicating pregnancy, unspecified trimester: Secondary | ICD-10-CM | POA: Insufficient documentation

## 2024-04-27 DIAGNOSIS — O219 Vomiting of pregnancy, unspecified: Secondary | ICD-10-CM

## 2024-04-27 DIAGNOSIS — O99713 Diseases of the skin and subcutaneous tissue complicating pregnancy, third trimester: Secondary | ICD-10-CM

## 2024-04-27 DIAGNOSIS — D573 Sickle-cell trait: Secondary | ICD-10-CM

## 2024-04-27 DIAGNOSIS — L299 Pruritus, unspecified: Secondary | ICD-10-CM | POA: Insufficient documentation

## 2024-04-27 DIAGNOSIS — B009 Herpesviral infection, unspecified: Secondary | ICD-10-CM

## 2024-04-27 MED ORDER — ONDANSETRON 4 MG PO TBDP: 4.0000 mg | ORAL_TABLET | Freq: Four times a day (QID) | ORAL | 2 refills | Status: DC | PRN

## 2024-04-27 MED ORDER — FERROUS SULFATE 325 (65 FE) MG PO TBEC: 325.0000 mg | DELAYED_RELEASE_TABLET | ORAL | 3 refills | Status: DC

## 2024-04-27 MED ORDER — DIPHENHYDRAMINE HCL 12.5 MG/5ML PO LIQD: 12.5000 mg | Freq: Four times a day (QID) | ORAL | 0 refills | Status: DC | PRN

## 2024-04-27 MED ORDER — VALACYCLOVIR HCL 500 MG PO TABS: 500.0000 mg | ORAL_TABLET | Freq: Two times a day (BID) | ORAL | 6 refills | Status: DC

## 2024-04-27 NOTE — Patient Instructions (Addendum)
 Floradix liquid iron supplement    TDaP Vaccine Pregnancy Get the Whooping Cough Vaccine While You Are Pregnant (CDC)  It is important for women to get the whooping cough vaccine in the third trimester of each pregnancy. Vaccines are the best way to prevent this disease. There are 2 different whooping cough vaccines. Both vaccines combine protection against whooping cough, tetanus and diphtheria, but they are for different age groups: Tdap: for everyone 11 years or older, including pregnant women  DTaP: for children 2 months through 42 years of age  You need the whooping cough vaccine during each of your pregnancies The recommended time to get the shot is during your 27th through 36th week of pregnancy, preferably during the earlier part of this time period. The Centers for Disease Control and Prevention (CDC) recommends that pregnant women receive the whooping cough vaccine for adolescents and adults (called Tdap vaccine) during the third trimester of each pregnancy. The recommended time to get the shot is during your 27th through 36th week of pregnancy, preferably during the earlier part of this time period. This replaces the original recommendation that pregnant women get the vaccine only if they had not previously received it. The [employer] of Obstetricians and Gynecologists and the [employer] support this recommendation.  You should get the whooping cough vaccine while pregnant to pass protection to your baby frame support disabled and/or not supported in this browser  Learn why Rice Chamorro decided to get the whooping cough vaccine in her 3rd trimester of pregnancy and how her baby girl was born with some protection against the disease. Also available on YouTube. After receiving the whooping cough vaccine, your body will create protective antibodies (proteins produced by the body to fight off diseases) and pass some of them to your baby before birth. These antibodies  provide your baby some short-term protection against whooping cough in early life. These antibodies can also protect your baby from some of the more serious complications that come along with whooping cough. Your protective antibodies are at their highest about 2 weeks after getting the vaccine, but it takes time to pass them to your baby. So the preferred time to get the whooping cough vaccine is early in your third trimester. The amount of whooping cough antibodies in your body decreases over time. That is why CDC recommends you get a whooping cough vaccine during each pregnancy. Doing so allows each of your babies to get the greatest number of protective antibodies from you. This means each of your babies will get the best protection possible against this disease.  Getting the whooping cough vaccine while pregnant is better than getting the vaccine after you give birth Whooping cough vaccination during pregnancy is ideal so your baby will have short-term protection as soon as he is born. This early protection is important because your baby will not start getting his whooping cough vaccines until he is 2 months old. These first few months of life are when your baby is at greatest risk for catching whooping cough. This is also when he's at greatest risk for having severe, potentially life-threating complications from the infection. To avoid that gap in protection, it is best to get a whooping cough vaccine during pregnancy. You will then pass protection to your baby before he is born. To continue protecting your baby, he should get whooping cough vaccines starting at 2 months old. You may never have gotten the Tdap vaccine before and did not get it during this  pregnancy. If so, you should make sure to get the vaccine immediately after you give birth, before leaving the hospital or birthing center. It will take about 2 weeks before your body develops protection (antibodies) in response to the vaccine. Once you  have protection from the vaccine, you are less likely to give whooping cough to your newborn while caring for him. But remember, your baby will still be at risk for catching whooping cough from others. A recent study looked to see how effective Tdap was at preventing whooping cough in babies whose mothers got the vaccine while pregnant or in the hospital after giving birth. The study found that getting Tdap between 27 through 36 weeks of pregnancy is 85% more effective at preventing whooping cough in babies younger than 2 months old. Blood tests cannot tell if you need a whooping cough vaccine There are no blood tests that can tell you if you have enough antibodies in your body to protect yourself or your baby against whooping cough. Even if you have been sick with whooping cough in the past or previously received the vaccine, you still should get the vaccine during each pregnancy. Breastfeeding may pass some protective antibodies onto your baby By breastfeeding, you may pass some antibodies you have made in response to the vaccine to your baby. When you get a whooping cough vaccine during your pregnancy, you will have antibodies in your breast milk that you can share with your baby as soon as your milk comes in. However, your baby will not get protective antibodies immediately if you wait to get the whooping cough vaccine until after delivering your baby. This is because it takes about 2 weeks for your body to create antibodies. Learn more about the health benefits of breastfeeding.

## 2024-04-27 NOTE — Progress Notes (Signed)
   PRENATAL VISIT NOTE  Subjective:  Jane Padilla is a 28 y.o. G1P0 at [redacted]w[redacted]d being seen today for ongoing prenatal care.  She is currently monitored for the following issues for this low-risk pregnancy and has Supervision of other normal pregnancy, antepartum; Sickle cell trait (HCC); Irritable bowel syndrome with constipation; Migraines; Ovarian torsion; Rosacea; HSV infection; and Pruritus of pregnancy on their problem list.   Patient reports  itching and rash since beginning  .   . Vag. Bleeding: None.  Movement: Present. Denies leaking of fluid.   The following portions of the patient's history were reviewed and updated as appropriate: allergies, current medications, past family history, past medical history, past social history, past surgical history and problem list.   Objective:   Vitals:   04/27/24 1612  BP: 107/71  Pulse: 79  Weight: 197 lb 11.2 oz (89.7 kg)    Fetal Status: Fetal Heart Rate (bpm): 154   Movement: Present     General:  Alert, oriented and cooperative. Patient is in no acute distress.  Skin: Skin is warm and dry. No rash noted.   Cardiovascular: Normal heart rate noted  Respiratory: Normal respiratory effort, no problems with respiration noted  Abdomen: Soft, gravid, appropriate for gestational age.  Pain/Pressure: Present     Pelvic: Cervical exam deferred        Extremities: Normal range of motion.  Edema: None  Mental Status: Normal mood and affect. Normal behavior. Normal judgment and thought content.   Assessment and Plan:  Pregnancy: G1P0 at [redacted]w[redacted]d 1. Supervision of other normal pregnancy, antepartum (Primary) - Tdap vaccine greater than or equal to 7yo IM - ferrous sulfate  325 (65 FE) MG EC tablet; Take 1 tablet (325 mg total) by mouth every other day.  Dispense: 30 tablet; Refill: 3  2. Sickle cell trait (HCC)  3. HSV infection - valACYclovir  (VALTREX ) 500 MG tablet; Take 1 tablet (500 mg total) by mouth 2 (two) times daily. Start  suppression at 36 weeks  Dispense: 60 tablet; Refill: 6  4. [redacted] weeks gestation of pregnancy  5. Pruritus of pregnancy in third trimester - diphenhydrAMINE  (BENADRYL  CHILDRENS ALLERGY) 12.5 MG/5ML liquid; Take 5 mLs (12.5 mg total) by mouth 4 (four) times daily as needed.  Dispense: 118 mL; Refill: 0  6. Nausea/vomiting in pregnancy - ondansetron  (ZOFRAN -ODT) 4 MG disintegrating tablet; Take 1 tablet (4 mg total) by mouth every 6 (six) hours as needed for nausea or vomiting.  Dispense: 30 tablet; Refill: 2  Preterm labor symptoms and general obstetric precautions including but not limited to vaginal bleeding, contractions, leaking of fluid and fetal movement were reviewed in detail with the patient. Please refer to After Visit Summary for other counseling recommendations.   No follow-ups on file.  Future Appointments  Date Time Provider Department Center  05/18/2024  3:55 PM Valeri Gate , CNM [facility]  06/01/2024  3:55 PM Ayame Rena Grim , CNM [facility]  06/08/2024 11:15 AM Felipe Horton, Keigo Whalley , CNM [facility]    Delan Ksiazek  Felipe Horton, CNM [facility] for [employer]

## 2024-05-04 ENCOUNTER — Encounter: Payer: Self-pay | Admitting: Advanced Practice Midwife

## 2024-05-18 ENCOUNTER — Encounter: Payer: Self-pay | Admitting: Advanced Practice Midwife

## 2024-05-18 ENCOUNTER — Other Ambulatory Visit: Payer: Self-pay

## 2024-05-18 ENCOUNTER — Ambulatory Visit (INDEPENDENT_AMBULATORY_CARE_PROVIDER_SITE_OTHER): Payer: Self-pay | Admitting: Advanced Practice Midwife

## 2024-05-18 VITALS — BP 102/68 | HR 94 | Wt 197.0 lb

## 2024-05-18 DIAGNOSIS — O2643 Herpes gestationis, third trimester: Secondary | ICD-10-CM

## 2024-05-18 DIAGNOSIS — D573 Sickle-cell trait: Secondary | ICD-10-CM

## 2024-05-18 DIAGNOSIS — B009 Herpesviral infection, unspecified: Secondary | ICD-10-CM

## 2024-05-18 DIAGNOSIS — Z3480 Encounter for supervision of other normal pregnancy, unspecified trimester: Secondary | ICD-10-CM

## 2024-05-18 DIAGNOSIS — O99113 Other diseases of the blood and blood-forming organs and certain disorders involving the immune mechanism complicating pregnancy, third trimester: Secondary | ICD-10-CM

## 2024-05-18 DIAGNOSIS — Z3A33 33 weeks gestation of pregnancy: Secondary | ICD-10-CM

## 2024-05-18 NOTE — Progress Notes (Unsigned)
   PRENATAL VISIT NOTE  Subjective:  Jane Padilla is a 28 y.o. G1P0 at [redacted]w[redacted]d being seen today for ongoing prenatal care.  She is currently monitored for the following issues for this low-risk pregnancy and has Supervision of other normal pregnancy, antepartum; Sickle cell trait (HCC); Irritable bowel syndrome with constipation; Migraines; Ovarian torsion; Rosacea; HSV infection; and Pruritus of pregnancy on their problem list.   Patient reports {sx:14538}.  Contractions: Not present. Vag. Bleeding: None.  Movement: Present. Denies leaking of fluid.   The following portions of the patient's history were reviewed and updated as appropriate: allergies, current medications, past family history, past medical history, past social history, past surgical history and problem list.   Objective:   Vitals:   05/18/24 1635  BP: 102/68  Pulse: 94  Weight: 197 lb (89.4 kg)    Fetal Status: Fetal Heart Rate (bpm): 154   Movement: Present     General:  Alert, oriented and cooperative. Patient is in no acute distress.  Skin: Skin is warm and dry. No rash noted.   Cardiovascular: Normal heart rate noted  Respiratory: Normal respiratory effort, no problems with respiration noted  Abdomen: Soft, gravid, appropriate for gestational age.  Pain/Pressure: Absent     Pelvic: {Blank single:19197::"Cervical exam performed in the presence of a chaperone","Cervical exam deferred"}        Extremities: Normal range of motion.  Edema: None  Mental Status: Normal mood and affect. Normal behavior. Normal judgment and thought content.   Assessment and Plan:  Pregnancy: G1P0 at [redacted]w[redacted]d 1. [redacted] weeks gestation of pregnancy (Primary)  2. Supervision of other normal pregnancy, antepartum  3. Sickle cell trait (HCC)  4. HSV infection  {Blank single:19197::"Term","Preterm"} labor symptoms and general obstetric precautions including but not limited to vaginal bleeding, contractions, leaking of fluid and fetal  movement were reviewed in detail with the patient. Please refer to After Visit Summary for other counseling recommendations.   No follow-ups on file.  Future Appointments  Date Time Provider Department Center  06/01/2024  3:55 PM Almond Jaffe , CNM [facility]  06/08/2024 11:15 AM Felipe Horton, Loraine Bhullar , CNM [facility]    Trasean Delima  Benard Brackett [facility] for [employer]

## 2024-05-18 NOTE — Patient Instructions (Signed)
  Try the [employer] at [email or web address] daily to improve baby's position and encourage the onset of labor.

## 2024-06-01 ENCOUNTER — Other Ambulatory Visit: Payer: Self-pay

## 2024-06-01 ENCOUNTER — Ambulatory Visit (INDEPENDENT_AMBULATORY_CARE_PROVIDER_SITE_OTHER): Payer: Self-pay | Admitting: Advanced Practice Midwife

## 2024-06-01 VITALS — BP 98/67 | HR 98 | Wt 204.0 lb

## 2024-06-01 DIAGNOSIS — O99713 Diseases of the skin and subcutaneous tissue complicating pregnancy, third trimester: Secondary | ICD-10-CM

## 2024-06-01 DIAGNOSIS — L299 Pruritus, unspecified: Secondary | ICD-10-CM

## 2024-06-01 DIAGNOSIS — Z3480 Encounter for supervision of other normal pregnancy, unspecified trimester: Secondary | ICD-10-CM

## 2024-06-01 DIAGNOSIS — B009 Herpesviral infection, unspecified: Secondary | ICD-10-CM

## 2024-06-01 DIAGNOSIS — Z3A35 35 weeks gestation of pregnancy: Secondary | ICD-10-CM
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
## 2024-06-07 NOTE — Progress Notes (Signed)
   PRENATAL VISIT NOTE  Subjective:  Jane Padilla is a 28 y.o. G1P0 at [redacted]w[redacted]d being seen today for ongoing prenatal care.  She is currently monitored for the following issues for this low-risk pregnancy and has Supervision of other normal pregnancy, antepartum; Sickle cell trait (HCC); Irritable bowel syndrome with constipation; Migraines; Ovarian torsion; Rosacea; HSV infection; and Pruritus of pregnancy on their problem list.   Patient reports occasional contractions.  Contractions: Irregular. Vag. Bleeding: None.  Movement: Present. Denies leaking of fluid.   The following portions of the patient's history were reviewed and updated as appropriate: allergies, current medications, past family history, past medical history, past social history, past surgical history and problem list.   Objective:   Vitals:   06/08/24 1200  BP: 114/76  Pulse: 69    Fetal Status: Fetal Heart Rate (bpm): 136 Fundal Height: 40 cm Movement: Present  Presentation: Vertex  General:  Alert, oriented and cooperative. Patient is in no acute distress.  Skin: Skin is warm and dry. No rash noted.   Cardiovascular: Normal heart rate noted  Respiratory: Normal respiratory effort, no problems with respiration noted  Abdomen: Soft, gravid, appropriate for gestational age.  Pain/Pressure: Present     Pelvic: Cervical exam performed in the presence of a chaperone Dilation: Closed Effacement (%): 0 Station: Ballotable.Natale Bail is OP  Extremities: Normal range of motion.  Edema: Trace  Mental Status: Normal mood and affect. Normal behavior. Normal judgment and thought content.   Assessment and Plan:  Pregnancy: G1P0 at [redacted]w[redacted]d 1. Supervision of other normal pregnancy, antepartum (Primary) - Culture, beta strep (group b only) - GC/Chlamydia probe amp ([place])not at [facility]  2. Pruritus of pregnancy in third trimester  3. HSV infection - No outbreak. - On Valtrex  Prophylaxis.  4. [redacted] weeks gestation of  pregnancy - Culture, beta strep (group b only) - GC/Chlamydia probe amp ([place])not at [facility]  Term labor symptoms and general obstetric precautions including but not limited to vaginal bleeding, contractions, leaking of fluid and fetal movement were reviewed in detail with the patient. Please refer to After Visit Summary for other counseling recommendations.   No follow-ups on file.  Future Appointments  Date Time Provider Department Center  06/14/2024  2:55 PM Cleophas Dadds [facility]  06/19/2024  3:15 PM Zelma Hidden, FNP [facility]  06/26/2024  2:35 PM Felipe Horton, Camani Sesay , CNM [facility]    Makaelyn Aponte  Felipe Horton, CNM [facility] Center for [employer]

## 2024-06-08 ENCOUNTER — Other Ambulatory Visit (HOSPITAL_COMMUNITY)
Admission: RE | Admit: 2024-06-08 | Discharge: 2024-06-08 | Disposition: A | Discharge status: Home / Self Care | Source: Ambulatory Visit | Attending: Advanced Practice Midwife | Admitting: Advanced Practice Midwife

## 2024-06-08 ENCOUNTER — Ambulatory Visit (INDEPENDENT_AMBULATORY_CARE_PROVIDER_SITE_OTHER): Payer: Self-pay | Admitting: Advanced Practice Midwife

## 2024-06-08 ENCOUNTER — Other Ambulatory Visit: Payer: Self-pay

## 2024-06-08 VITALS — BP 114/76 | HR 69

## 2024-06-08 DIAGNOSIS — Z3480 Encounter for supervision of other normal pregnancy, unspecified trimester: Secondary | ICD-10-CM

## 2024-06-08 DIAGNOSIS — O99713 Diseases of the skin and subcutaneous tissue complicating pregnancy, third trimester: Secondary | ICD-10-CM

## 2024-06-08 DIAGNOSIS — O2643 Herpes gestationis, third trimester: Secondary | ICD-10-CM

## 2024-06-08 DIAGNOSIS — Z3A36 36 weeks gestation of pregnancy: Secondary | ICD-10-CM | POA: Insufficient documentation

## 2024-06-08 DIAGNOSIS — B009 Herpesviral infection, unspecified: Secondary | ICD-10-CM

## 2024-06-08 DIAGNOSIS — L299 Pruritus, unspecified: Secondary | ICD-10-CM

## 2024-06-09 LAB — GC/CHLAMYDIA PROBE AMP (~~LOC~~) NOT AT ARMC
Chlamydia: NEGATIVE
Comment: NEGATIVE
Comment: NORMAL
Neisseria Gonorrhea: NEGATIVE

## 2024-06-11 ENCOUNTER — Ambulatory Visit: Payer: Self-pay | Admitting: Advanced Practice Midwife

## 2024-06-11 ENCOUNTER — Encounter: Payer: Self-pay | Admitting: Advanced Practice Midwife

## 2024-06-11 DIAGNOSIS — O99820 Streptococcus B carrier state complicating pregnancy: Secondary | ICD-10-CM | POA: Insufficient documentation

## 2024-06-11 LAB — CULTURE, BETA STREP (GROUP B ONLY): Strep Gp B Culture: POSITIVE — AB

## 2024-06-14 ENCOUNTER — Ambulatory Visit (INDEPENDENT_AMBULATORY_CARE_PROVIDER_SITE_OTHER): Payer: Self-pay | Admitting: Certified Nurse Midwife

## 2024-06-14 ENCOUNTER — Other Ambulatory Visit: Payer: Self-pay

## 2024-06-14 VITALS — BP 112/74 | HR 92 | Wt 209.4 lb

## 2024-06-14 DIAGNOSIS — Z3493 Encounter for supervision of normal pregnancy, unspecified, third trimester: Secondary | ICD-10-CM

## 2024-06-14 DIAGNOSIS — Z3A37 37 weeks gestation of pregnancy: Secondary | ICD-10-CM

## 2024-06-14 DIAGNOSIS — O26893 Other specified pregnancy related conditions, third trimester: Secondary | ICD-10-CM

## 2024-06-14 DIAGNOSIS — M549 Dorsalgia, unspecified: Secondary | ICD-10-CM

## 2024-06-14 MED ORDER — MAG-OXIDE 200 MG PO TABS
400.0000 mg | ORAL_TABLET | Freq: Every day | ORAL | 3 refills | Status: DC
Start: 2024-06-14 — End: 2024-07-06

## 2024-06-14 NOTE — Patient Instructions (Signed)
 Can use the Calm brand of magnesium for pain relief and sleep, also try arnica oil or lotion on your back and cat/cow yoga stretches.

## 2024-06-14 NOTE — Progress Notes (Signed)
   PRENATAL VISIT NOTE  Subjective:  Jane Padilla is a 28 y.o. G1P0 at [redacted]w[redacted]d being seen today for ongoing prenatal care.  She is currently monitored for the following issues for this low-risk pregnancy and has Supervision of other normal pregnancy, antepartum; Sickle cell trait (HCC); Irritable bowel syndrome with constipation; Migraines; Ovarian torsion; Rosacea; HSV infection; Pruritus of pregnancy; and Group B Streptococcus carrier, antepartum on their problem list.  Patient reports no complaints.  Contractions: Irregular. Vag. Bleeding: None.  Movement: Present. Denies leaking of fluid.   The following portions of the patient's history were reviewed and updated as appropriate: allergies, current medications, past family history, past medical history, past social history, past surgical history and problem list.   Objective:    Vitals:   06/14/24 1507  BP: 112/74  Pulse: 92  Weight: 209 lb 6.4 oz (95 kg)    Fetal Status:  Fetal Heart Rate (bpm): 138 Fundal Height: 38 cm Movement: Present    General: Alert, oriented and cooperative. Patient is in no acute distress.  Skin: Skin is warm and dry. No rash noted.   Cardiovascular: Normal heart rate noted  Respiratory: Normal respiratory effort, no problems with respiration noted  Abdomen: Soft, gravid, appropriate for gestational age.  Pain/Pressure: Present     Pelvic: Cervical exam deferred        Extremities: Normal range of motion.  Edema: Trace  Mental Status: Normal mood and affect. Normal behavior. Normal judgment and thought content.   Assessment and Plan:  Pregnancy: G1P0 at [redacted]w[redacted]d 1. Encounter for supervision of low-risk pregnancy in third trimester (Primary) - Doing well, feeling regular and vigorous fetal movement   2. [redacted] weeks gestation of pregnancy - Routine OB care - Pt is interested in waterbirth.  No contraindications at this time per chart review/patient assessment.   - Has attended the class, will see CNM  for remaining visits - Discussed waterbirth as option for low-risk pregnancy.  Reviewed conditions that may arise during pregnancy that will risk pt out of waterbirth including hypertension, diabetes, fetal growth restriction <10%ile, etc.  3. Back pain affecting pregnancy - Discussed stretches, use of magnesium and arnica gel/lotion and applied a maternity belt - Magnesium Oxide -Mg Supplement (MAG-OXIDE) 200 MG TABS; Take 2 tablets (400 mg total) by mouth at bedtime. If that amount causes loose stools in the am, switch to 200mg  daily at bedtime.  Dispense: 60 tablet; Refill: 3  Term labor symptoms and general obstetric precautions including but not limited to vaginal bleeding, contractions, leaking of fluid and fetal movement were reviewed in detail with the patient. Please refer to After Visit Summary for other counseling recommendations.   Return in about 1 week (around 06/21/2024) for IN-PERSON, LOB.  Future Appointments  Date Time Provider Department Center  06/21/2024  1:55 PM Cleophas Dadds [facility]  06/28/2024  3:35 PM Derick Fleeting, CNM [facility]    Derick Fleeting, CNM

## 2024-06-15 ENCOUNTER — Encounter: Payer: Self-pay | Admitting: Certified Nurse Midwife

## 2024-06-19 ENCOUNTER — Encounter: Admitting: Obstetrics and Gynecology

## 2024-06-20 ENCOUNTER — Ambulatory Visit (INDEPENDENT_AMBULATORY_CARE_PROVIDER_SITE_OTHER): Payer: PRIVATE HEALTH INSURANCE | Admitting: Family Medicine

## 2024-06-20 ENCOUNTER — Encounter: Payer: Self-pay | Admitting: Family Medicine

## 2024-06-20 VITALS — BP 120/77 | HR 88 | Wt 213.0 lb

## 2024-06-20 DIAGNOSIS — N393 Stress incontinence (female) (male): Secondary | ICD-10-CM

## 2024-06-20 DIAGNOSIS — Z3A38 38 weeks gestation of pregnancy: Secondary | ICD-10-CM | POA: Diagnosis not present

## 2024-06-20 DIAGNOSIS — O26893 Other specified pregnancy related conditions, third trimester: Secondary | ICD-10-CM

## 2024-06-20 LAB — POCT FERNING: Ferning, POC: NEGATIVE

## 2024-06-20 NOTE — Telephone Encounter (Signed)
 Seen in office today

## 2024-06-20 NOTE — Progress Notes (Signed)
   Subjective:  Jane Padilla is a 28 y.o. G1P0 at [redacted]w[redacted]d being seen today with concern for possible rupture of membranes. She states that throughout the night, she typically wakes up to use the bathroom around 12am, 3:30am, and 6am. Last night, she went to bed a bit later than usual and woke up around 4am with the sensation of liquid trickling down her leg. She got up and used the bathroom and was able to empty her bladder. She is not sure if it was urine or her water breaking. She has not been leaking any additional fluid throughout the day. She endorses occasional irregular contractions for the past week or so, endorses good fetal movement.  The following portions of the patient's history were reviewed and updated as appropriate: allergies, current medications, past family history, past medical history, past social history, past surgical history and problem list.   Objective:    Vitals:   06/20/24 1458  BP: 120/77  Pulse: 88  Weight: 213 lb (96.6 kg)    Fetal Status:  Fetal Heart Rate (bpm): 156   Movement: Present    General: Alert, oriented and cooperative. Patient is in no acute distress.  Skin: Skin is warm and dry. No rash noted.   Cardiovascular: Normal heart rate noted  Respiratory: Normal respiratory effort, no problems with respiration noted  Abdomen: Soft, gravid, appropriate for gestational age.  Pain/Pressure: Present     Pelvic: VULVA: normal appearing vulva with no masses, tenderness or lesions, VAGINA: normal appearing vagina with normal color and discharge, no lesions, CERVIX: normal appearing cervix without discharge or lesions, visually closed, thick white discharge, exam chaperoned by Cyndee Molt RN.         Extremities: Normal range of motion.  Edema: Mild pitting, slight indentation  Mental Status: Normal mood and affect. Normal behavior. Normal judgment and thought content.   Results for orders placed or performed in visit on 06/20/24  POCT Ferning  Result  Value Ref Range   Ferning, POC Negative Negative    Assessment and Plan:  Pregnancy: G1P0 at [redacted]w[redacted]d 1. Stress incontinence of urine (Primary) No pooling on exam, fern negative.  2. [redacted] weeks gestation of pregnancy    Term labor symptoms and general obstetric precautions including but not limited to vaginal bleeding, contractions, leaking of fluid and fetal movement were reviewed in detail with the patient. Please refer to After Visit Summary for other counseling recommendations.   Future Appointments  Date Time Provider Department Center  06/21/2024  1:55 PM Vannie Cornell JONELLE EDDY [facility]  06/28/2024  3:35 PM Vannie Cornell JONELLE, CNM [facility]    Joesph DELENA Sear, [place]

## 2024-06-20 NOTE — Patient Instructions (Addendum)
 Things to Try After 37 weeks to Encourage Labor/Get Ready for Labor:    Try the [employer] at [email or web address].[email or web address] daily to improve baby's position and encourage the onset of labor.  Walk a little and rest a little every day.  Change positions often.  Cervical Ripening: May try one or both Red Raspberry Leaf capsules or tea:  two 300mg  or 400mg  tablets with each meal, 2-3 times a day, or 1-3 cups of tea daily  Potential Side Effects Of Raspberry Leaf:  Most women do not experience any side effects from drinking raspberry leaf tea. However, nausea and loose stools are possible   Evening Primrose Oil capsules: take 1 capsule by mouth and place one capsule in the vagina every night.    Some of the potential side effects:  Upset stomach  Loose stools or diarrhea  Headaches  Nausea  Sex can also help the cervix ripen and encourage labor onset.    Reasons to go to MAU at [facility]: More than 36 weeks: You begin to have strong, frequent contractions 5 minutes apart or less, each last 1 minute, these have been going on for 1-2 hours, and you cannot walk or talk during them. Your water breaks.  Sometimes it is a big gush of fluid. However, many times it may it may be much more subtle. You should go to the hospital if you have a constant leakage of fluid from your vagina, enough to soak a pad when you are walking around.  You have vaginal bleeding.  It is normal to have a small amount of spotting if your cervix was checked. If you have bleeding requiring the use of a pad, go to the hospital. You don't feel your baby moving like normal.  If you think that you baby's movement is decreased, eat a snack and rest on your left side in a quiet room for one hour. If you have not felt the baby move more than 6 times in an hour GO TO THE HOSPITAL.

## 2024-06-21 ENCOUNTER — Other Ambulatory Visit: Payer: Self-pay

## 2024-06-21 ENCOUNTER — Ambulatory Visit (INDEPENDENT_AMBULATORY_CARE_PROVIDER_SITE_OTHER): Payer: Self-pay | Admitting: Certified Nurse Midwife

## 2024-06-21 VITALS — BP 119/69 | HR 89 | Ht 61.5 in | Wt 211.0 lb

## 2024-06-21 DIAGNOSIS — O2643 Herpes gestationis, third trimester: Secondary | ICD-10-CM

## 2024-06-21 DIAGNOSIS — Z3A38 38 weeks gestation of pregnancy: Secondary | ICD-10-CM

## 2024-06-21 DIAGNOSIS — B009 Herpesviral infection, unspecified: Secondary | ICD-10-CM

## 2024-06-21 DIAGNOSIS — Z3493 Encounter for supervision of normal pregnancy, unspecified, third trimester: Secondary | ICD-10-CM

## 2024-06-21 NOTE — Progress Notes (Signed)
   PRENATAL VISIT NOTE  Subjective:  Jane Padilla is a 28 y.o. G1P0 at [redacted]w[redacted]d being seen today for ongoing prenatal care.  She is currently monitored for the following issues for this low-risk pregnancy and has Supervision of other normal pregnancy, antepartum; Sickle cell trait (HCC); Irritable bowel syndrome with constipation; Migraines; Ovarian torsion; Rosacea; HSV infection; Pruritus of pregnancy; and Group B Streptococcus carrier, antepartum on their problem list.  Patient reports pelvic pain and pressure that was severe about 1 week ago that resolved after 2 days of resting in bed. She reports active fetal movement and is anticipating devivery.  Contractions: Not present. Vag. Bleeding: None.  Movement: Present. Denies leaking of fluid.   The following portions of the patient's history were reviewed and updated as appropriate: allergies, current medications, past family history, past medical history, past social history, past surgical history and problem list.   Objective:    Vitals:   06/21/24 1415 06/21/24 1421  BP: 119/69   Pulse: 89   Weight: 95.7 kg   Height:  5' 1.5 (1.562 m)    Fetal Status:  Fetal Heart Rate (bpm): 146 Fundal Height: 39 cm Movement: Present    General: Alert, oriented and cooperative. Patient is in no acute distress.  Skin: Skin is warm and dry. No rash noted.   Cardiovascular: Normal heart rate noted  Respiratory: Normal respiratory effort, no problems with respiration noted  Abdomen: Soft, gravid, appropriate for gestational age.  Pain/Pressure: Absent     Pelvic: Cervical exam deferred        Extremities: Normal range of motion.  Edema: Mild pitting, slight indentation  Mental Status: Normal mood and affect. Normal behavior. Normal judgment and thought content.   Assessment and Plan:  Pregnancy: G1P0 at [redacted]w[redacted]d 1. Encounter for supervision of low-risk pregnancy in third trimester (Primary) - Doing well overall. +FM.  - Discussed how relaxin  relaxes the joints in the pelvis and can cause discomfort. Encouraged using heat or ice, supportive pillows, tylenol, and abdominal support band if the pain returns. - Answered questions about induction methods (cytotec, FB, AROM, ambulation, and Pitocin).  2. [redacted] weeks gestation of pregnancy - Routine OB care  3. HSV infection - Currently taking Valtrex   Term labor symptoms and general obstetric precautions including but not limited to vaginal bleeding, contractions, leaking of fluid and fetal movement were reviewed in detail with the patient. Please refer to After Visit Summary for other counseling recommendations.     Future Appointments  Date Time Provider Department Center  06/28/2024  3:35 PM Vannie Cornell JONELLE EDDY [facility]    Mitzie JINNY Molly, [place]

## 2024-06-26 ENCOUNTER — Encounter: Payer: Self-pay | Admitting: Advanced Practice Midwife

## 2024-06-28 ENCOUNTER — Other Ambulatory Visit: Payer: Self-pay

## 2024-06-28 ENCOUNTER — Ambulatory Visit (INDEPENDENT_AMBULATORY_CARE_PROVIDER_SITE_OTHER): Admitting: Certified Nurse Midwife

## 2024-06-28 VITALS — BP 114/79 | HR 94 | Wt 209.5 lb

## 2024-06-28 DIAGNOSIS — Z3A39 39 weeks gestation of pregnancy: Secondary | ICD-10-CM

## 2024-06-28 DIAGNOSIS — O99820 Streptococcus B carrier state complicating pregnancy: Secondary | ICD-10-CM | POA: Diagnosis not present

## 2024-06-28 DIAGNOSIS — Z3493 Encounter for supervision of normal pregnancy, unspecified, third trimester: Secondary | ICD-10-CM

## 2024-06-28 NOTE — Progress Notes (Unsigned)
   PRENATAL VISIT NOTE  Subjective:  Jane Padilla is a 28 y.o. G1P0 at [redacted]w[redacted]d being seen today for ongoing prenatal care.  She is currently monitored for the following issues for this {Blank single:19197::high-risk,low-risk} pregnancy and has Supervision of low-risk pregnancy; Sickle cell trait (HCC); Irritable bowel syndrome with constipation; Migraines; Ovarian torsion; Rosacea; HSV infection; Pruritus of pregnancy; and Group B Streptococcus carrier, antepartum on their problem list.  Patient reports {sx:14538}.  Contractions: Irritability. Vag. Bleeding: None.  Movement: Present. Denies leaking of fluid.   The following portions of the patient's history were reviewed and updated as appropriate: allergies, current medications, past family history, past medical history, past social history, past surgical history and problem list.   Objective:    Vitals:   06/28/24 1542  BP: 114/79  Pulse: 94  Weight: 209 lb 8 oz (95 kg)    Fetal Status:  Fetal Heart Rate (bpm): 136 Fundal Height: 39 cm Movement: Present Presentation: Vertex  General: Alert, oriented and cooperative. Patient is in no acute distress.  Skin: Skin is warm and dry. No rash noted.   Cardiovascular: Normal heart rate noted  Respiratory: Normal respiratory effort, no problems with respiration noted  Abdomen: Soft, gravid, appropriate for gestational age.  Pain/Pressure: Present (pelvic pain)     Pelvic: {Blank single:19197::Cervical exam performed in the presence of a chaperone,Cervical exam deferred} Dilation: 1 Effacement (%): 50 Station: -3  Extremities: Normal range of motion.  Edema: Trace  Mental Status: Normal mood and affect. Normal behavior. Normal judgment and thought content.   Assessment and Plan:  Pregnancy: G1P0 at [redacted]w[redacted]d 1. Encounter for supervision of low-risk pregnancy in third trimester (Primary) ***  2. [redacted] weeks gestation of pregnancy ***  3. Group B Streptococcus carrier,  antepartum ***  {Blank single:19197::Term,Preterm} labor symptoms and general obstetric precautions including but not limited to vaginal bleeding, contractions, leaking of fluid and fetal movement were reviewed in detail with the patient. Please refer to After Visit Summary for other counseling recommendations.   Return in about 1 week (around 07/05/2024) for IN-PERSON, LOB.  No future appointments.  Cornell JONELLE Finder, CNM

## 2024-06-29 ENCOUNTER — Encounter: Payer: Self-pay | Admitting: Certified Nurse Midwife

## 2024-06-29 NOTE — Addendum Note (Signed)
 Addended by: Yolanda Dockendorf on: 06/29/2024 01:56 PM   Modules accepted: Orders

## 2024-07-03 ENCOUNTER — Inpatient Hospital Stay (HOSPITAL_COMMUNITY)
Admission: RE | Admit: 2024-07-03 | Discharge: 2024-07-06 | DRG: 787 | Disposition: A | Discharge status: Home / Self Care | Attending: Family Medicine | Admitting: Family Medicine

## 2024-07-03 ENCOUNTER — Inpatient Hospital Stay (HOSPITAL_COMMUNITY): Admitting: Anesthesiology

## 2024-07-03 ENCOUNTER — Other Ambulatory Visit: Payer: Self-pay

## 2024-07-03 ENCOUNTER — Encounter (HOSPITAL_COMMUNITY): Payer: Self-pay | Admitting: Obstetrics and Gynecology

## 2024-07-03 DIAGNOSIS — O4292 Full-term premature rupture of membranes, unspecified as to length of time between rupture and onset of labor: Secondary | ICD-10-CM | POA: Diagnosis present

## 2024-07-03 DIAGNOSIS — O9912 Other diseases of the blood and blood-forming organs and certain disorders involving the immune mechanism complicating childbirth: Secondary | ICD-10-CM | POA: Diagnosis present

## 2024-07-03 DIAGNOSIS — O99820 Streptococcus B carrier state complicating pregnancy: Secondary | ICD-10-CM | POA: Diagnosis not present

## 2024-07-03 DIAGNOSIS — Z3689 Encounter for other specified antenatal screening: Secondary | ICD-10-CM

## 2024-07-03 DIAGNOSIS — D62 Acute posthemorrhagic anemia: Secondary | ICD-10-CM | POA: Diagnosis not present

## 2024-07-03 DIAGNOSIS — D72829 Elevated white blood cell count, unspecified: Secondary | ICD-10-CM | POA: Diagnosis present

## 2024-07-03 DIAGNOSIS — Z3A39 39 weeks gestation of pregnancy: Secondary | ICD-10-CM

## 2024-07-03 DIAGNOSIS — O9832 Other infections with a predominantly sexual mode of transmission complicating childbirth: Secondary | ICD-10-CM | POA: Diagnosis present

## 2024-07-03 DIAGNOSIS — O99824 Streptococcus B carrier state complicating childbirth: Secondary | ICD-10-CM | POA: Diagnosis present

## 2024-07-03 DIAGNOSIS — E66813 Obesity, class 3: Secondary | ICD-10-CM | POA: Diagnosis present

## 2024-07-03 DIAGNOSIS — O99214 Obesity complicating childbirth: Secondary | ICD-10-CM | POA: Diagnosis present

## 2024-07-03 DIAGNOSIS — B009 Herpesviral infection, unspecified: Secondary | ICD-10-CM | POA: Diagnosis present

## 2024-07-03 DIAGNOSIS — Z23 Encounter for immunization: Secondary | ICD-10-CM | POA: Diagnosis not present

## 2024-07-03 DIAGNOSIS — D573 Sickle-cell trait: Secondary | ICD-10-CM | POA: Diagnosis present

## 2024-07-03 DIAGNOSIS — Z3A40 40 weeks gestation of pregnancy: Secondary | ICD-10-CM

## 2024-07-03 DIAGNOSIS — L299 Pruritus, unspecified: Secondary | ICD-10-CM | POA: Diagnosis present

## 2024-07-03 DIAGNOSIS — Z3490 Encounter for supervision of normal pregnancy, unspecified, unspecified trimester: Secondary | ICD-10-CM

## 2024-07-03 DIAGNOSIS — A6000 Herpesviral infection of urogenital system, unspecified: Secondary | ICD-10-CM | POA: Diagnosis present

## 2024-07-03 DIAGNOSIS — O99719 Diseases of the skin and subcutaneous tissue complicating pregnancy, unspecified trimester: Secondary | ICD-10-CM | POA: Diagnosis present

## 2024-07-03 DIAGNOSIS — Z8249 Family history of ischemic heart disease and other diseases of the circulatory system: Secondary | ICD-10-CM

## 2024-07-03 DIAGNOSIS — O9081 Anemia of the puerperium: Secondary | ICD-10-CM | POA: Diagnosis not present

## 2024-07-03 DIAGNOSIS — O4202 Full-term premature rupture of membranes, onset of labor within 24 hours of rupture: Secondary | ICD-10-CM | POA: Diagnosis not present

## 2024-07-03 DIAGNOSIS — O4290 Premature rupture of membranes, unspecified as to length of time between rupture and onset of labor, unspecified weeks of gestation: Principal | ICD-10-CM

## 2024-07-03 DIAGNOSIS — Z833 Family history of diabetes mellitus: Secondary | ICD-10-CM | POA: Diagnosis not present

## 2024-07-03 DIAGNOSIS — Z3493 Encounter for supervision of normal pregnancy, unspecified, third trimester: Secondary | ICD-10-CM

## 2024-07-03 DIAGNOSIS — O480 Post-term pregnancy: Secondary | ICD-10-CM | POA: Diagnosis not present

## 2024-07-03 LAB — CBC
HCT: 33.2 % — ABNORMAL LOW (ref 36.0–46.0)
Hemoglobin: 10.9 g/dL — ABNORMAL LOW (ref 12.0–15.0)
MCH: 25.6 pg — ABNORMAL LOW (ref 26.0–34.0)
MCHC: 32.8 g/dL (ref 30.0–36.0)
MCV: 77.9 fL — ABNORMAL LOW (ref 80.0–100.0)
Platelets: 193 K/uL (ref 150–400)
RBC: 4.26 MIL/uL (ref 3.87–5.11)
RDW: 15.9 % — ABNORMAL HIGH (ref 11.5–15.5)
WBC: 9 K/uL (ref 4.0–10.5)
nRBC: 0 % (ref 0.0–0.2)

## 2024-07-03 LAB — TYPE AND SCREEN
ABO/RH(D): O POS
Antibody Screen: NEGATIVE

## 2024-07-03 LAB — RPR: RPR Ser Ql: NONREACTIVE

## 2024-07-03 LAB — POCT FERN TEST: POCT Fern Test: POSITIVE

## 2024-07-03 LAB — HEPATITIS B SURFACE ANTIGEN: Hepatitis B Surface Ag: NONREACTIVE

## 2024-07-03 MED ORDER — EPHEDRINE 5 MG/ML INJ: 10.0000 mg | INTRAVENOUS | Status: DC | PRN

## 2024-07-03 MED ORDER — PHENYLEPHRINE 80 MCG/ML (10ML) SYRINGE FOR IV PUSH (FOR BLOOD PRESSURE SUPPORT)
80.0000 ug | PREFILLED_SYRINGE | INTRAVENOUS | Status: DC | PRN
Start: 2024-07-03 — End: 2024-07-04
  Filled 2024-07-03: qty 10

## 2024-07-03 MED ORDER — TERBUTALINE SULFATE 1 MG/ML IJ SOLN
0.2500 mg | Freq: Once | INTRAMUSCULAR | Status: AC | PRN
  Administered 2024-07-03: 0.25 mg via SUBCUTANEOUS

## 2024-07-03 MED ORDER — FENTANYL-BUPIVACAINE-NACL 0.5-0.125-0.9 MG/250ML-% EP SOLN
12.0000 mL/h | EPIDURAL | Status: DC | PRN
  Administered 2024-07-03: 12 mL/h via EPIDURAL
  Filled 2024-07-03: qty 250

## 2024-07-03 MED ORDER — LIDOCAINE HCL (PF) 1 % IJ SOLN
30.0000 mL | INTRAMUSCULAR | Status: DC | PRN
Start: 2024-07-03 — End: 2024-07-04

## 2024-07-03 MED ORDER — SODIUM CHLORIDE 0.9 % IV SOLN
5.0000 10*6.[IU] | Freq: Once | INTRAVENOUS | Status: AC
  Administered 2024-07-03: 5 10*6.[IU] via INTRAVENOUS
  Filled 2024-07-03: qty 5

## 2024-07-03 MED ORDER — LACTATED RINGERS IV SOLN: 500.0000 mL | Freq: Once | INTRAVENOUS | Status: DC

## 2024-07-03 MED ORDER — PENICILLIN G POT IN DEXTROSE 60000 UNIT/ML IV SOLN
3.0000 10*6.[IU] | INTRAVENOUS | Status: DC
  Administered 2024-07-03 – 2024-07-04 (×3): 3 10*6.[IU] via INTRAVENOUS
  Filled 2024-07-03 (×5): qty 50

## 2024-07-03 MED ORDER — ONDANSETRON HCL 4 MG/2ML IJ SOLN
4.0000 mg | Freq: Four times a day (QID) | INTRAMUSCULAR | Status: DC | PRN
  Administered 2024-07-03: 4 mg via INTRAVENOUS
  Filled 2024-07-03: qty 2

## 2024-07-03 MED ORDER — OXYCODONE-ACETAMINOPHEN 5-325 MG PO TABS: 1.0000 | ORAL_TABLET | ORAL | Status: DC | PRN

## 2024-07-03 MED ORDER — DIPHENHYDRAMINE HCL 50 MG/ML IJ SOLN
12.5000 mg | INTRAMUSCULAR | Status: DC | PRN
  Administered 2024-07-04: 12.5 mg via INTRAVENOUS
  Filled 2024-07-03: qty 1

## 2024-07-03 MED ORDER — FENTANYL-BUPIVACAINE-NACL 0.5-0.125-0.9 MG/250ML-% EP SOLN: 12.0000 mL/h | EPIDURAL | Status: DC | PRN

## 2024-07-03 MED ORDER — LACTATED RINGERS IV SOLN: INTRAVENOUS | Status: DC

## 2024-07-03 MED ORDER — DIPHENHYDRAMINE HCL 50 MG/ML IJ SOLN: 12.5000 mg | INTRAMUSCULAR | Status: DC | PRN

## 2024-07-03 MED ORDER — SOD CITRATE-CITRIC ACID 500-334 MG/5ML PO SOLN
30.0000 mL | ORAL | Status: DC | PRN
  Administered 2024-07-04: 30 mL via ORAL
  Filled 2024-07-03: qty 30

## 2024-07-03 MED ORDER — OXYTOCIN-SODIUM CHLORIDE 30-0.9 UT/500ML-% IV SOLN
1.0000 m[IU]/min | INTRAVENOUS | Status: DC
  Administered 2024-07-03: 2 m[IU]/min via INTRAVENOUS

## 2024-07-03 MED ORDER — ACETAMINOPHEN 325 MG PO TABS: 650.0000 mg | ORAL_TABLET | ORAL | Status: DC | PRN

## 2024-07-03 MED ORDER — VALACYCLOVIR HCL 500 MG PO TABS
500.0000 mg | ORAL_TABLET | Freq: Two times a day (BID) | ORAL | Status: DC
  Administered 2024-07-03 (×2): 500 mg via ORAL
  Filled 2024-07-03 (×2): qty 1

## 2024-07-03 MED ORDER — LIDOCAINE HCL (PF) 1 % IJ SOLN
INTRAMUSCULAR | Status: DC | PRN
  Administered 2024-07-03: 8 mL via EPIDURAL

## 2024-07-03 MED ORDER — TERBUTALINE SULFATE 1 MG/ML IJ SOLN
INTRAMUSCULAR | Status: AC
  Filled 2024-07-03: qty 1

## 2024-07-03 MED ORDER — FENTANYL CITRATE (PF) 100 MCG/2ML IJ SOLN: 50.0000 ug | INTRAMUSCULAR | Status: DC | PRN

## 2024-07-03 MED ORDER — FLEET ENEMA RE ENEM: 1.0000 | ENEMA | RECTAL | Status: DC | PRN

## 2024-07-03 MED ORDER — OXYTOCIN-SODIUM CHLORIDE 30-0.9 UT/500ML-% IV SOLN
2.5000 [IU]/h | INTRAVENOUS | Status: DC
  Filled 2024-07-03: qty 500

## 2024-07-03 MED ORDER — PHENYLEPHRINE 80 MCG/ML (10ML) SYRINGE FOR IV PUSH (FOR BLOOD PRESSURE SUPPORT): 80.0000 ug | PREFILLED_SYRINGE | INTRAVENOUS | Status: DC | PRN

## 2024-07-03 MED ORDER — OXYTOCIN BOLUS FROM INFUSION: 333.0000 mL | Freq: Once | INTRAVENOUS | Status: DC

## 2024-07-03 MED ORDER — OXYCODONE-ACETAMINOPHEN 5-325 MG PO TABS: 2.0000 | ORAL_TABLET | ORAL | Status: DC | PRN

## 2024-07-03 MED ORDER — LACTATED RINGERS IV SOLN: 500.0000 mL | INTRAVENOUS | Status: DC | PRN

## 2024-07-03 MED ORDER — EPHEDRINE 5 MG/ML INJ
10.0000 mg | INTRAVENOUS | Status: DC | PRN
Start: 2024-07-03 — End: 2024-07-04

## 2024-07-03 MED ORDER — LACTATED RINGERS IV SOLN
500.0000 mL | Freq: Once | INTRAVENOUS | Status: DC
Start: 2024-07-03 — End: 2024-07-03

## 2024-07-03 NOTE — Progress Notes (Signed)
 Patient ID: Jane Padilla, female   DOB: 03/29/1996, 28 y.o.   MRN: [id number]   Subjective: -Care assumed of 28 y.o. G1P0 at [redacted]w[redacted]d who presents for AOL in setting of prolonged ROM. In room to meet acquaintance of patient and family.  Patient in bathroom and reports that she is not coping well with contractions.  Requests epidural.   Objective: BP 124/69   Pulse 76   Temp 98.1 F (36.7 C) (Oral)   Resp 18   LMP 10/01/2023   SpO2 98%  No intake/output data recorded. No intake/output data recorded.  Fetal Monitoring: FHT: 130 bpm, Mod Var, +Early Decels, +Accels UC: Palpates moderate    Physical Exam: General appearance: alert, well appearing, and in no distress. Chest: normal rate and regular rhythm.  No respiratory distress noted. Abdominal exam: Gravid, Appears AGA. Extremities: No edema Skin exam: Warm Dry  Vaginal Exam: SVE:   Dilation: Closed Effacement (%): Thick Exam by:: Camie Lao RN Membranes:SROM x 41 hours Internal Monitors: None  Augmentation/Induction: Pitocin :69mUn/min Cytotec: None  Assessment:  IUP at 40.3 weeks Cat I FT  AOL  Plan: -Reviewed cervical exam before or after epidural.  -Patient states that despite exam she would like epidural. -Encouraged to get epidural to promote comfort and provider will return to evaluate. -Patient agreeable.    Harlene CROME Ismael Karge,MSN, CNM 07/03/2024, 9:10 PM  Reassessment (10:09 PM) -Nurse call reports patient s/p epidural placement and prolonged deceleration noted. -Provider to bedside. Cervical exam as below. AROM of forebag with patient consent.  -Position change and discontinuation of pitocin  with improvement in fetal monitoring.  -Discussed fetal descent and what to expect as this occurs. -Patient without questions. -Family at bedside, supportive.  -Anticipate VD   Dilation: 8 Effacement (%): 90 Station: -2 Presentation: Vertex Exam by:: [employer], CNM   Harlene CROME [place]ans MSN, CNM Advanced Practice  Provider, Center for [employer]

## 2024-07-03 NOTE — Progress Notes (Signed)
 LABOR PROGRESS NOTE  Patient Name: Jane Padilla, female   DOB: [birth year], 28 y.o.  MRN: [id number]  Pit able to be restarted at 1402, currently 1U.  Cat 1 strip.  SVE unchanged with tight FT, posterior cervix.  Given unchanged cervical exam and pt's desires to forgo as much augmentation as possible will defer attempt at [facility] at this time.  Continue pit titration as tolerated.   Augustin JAYSON Slade, MD

## 2024-07-03 NOTE — MAU Note (Signed)
 G1P0 at 40.3 weeks reports possible SROM yesterday 0400 with LOF since then.  Denies any VB, reports good FM.  Last intercourse 2 days ago.    On valtrex  with in consistent use.

## 2024-07-03 NOTE — MAU Provider Note (Addendum)
 S: [name] is a 28 y.o. G1P0 at [redacted]w[redacted]d  who presents to MAU today complaining of leaking of fluid since 0430 07/02/24. She denies vaginal bleeding. She endorses contractions. She reports normal fetal movement.    O: BP 130/83 (BP Location: Right Arm)   Pulse (!) 106   Temp 98.6 F (37 C) (Oral)   Resp 16   LMP 10/01/2023   SpO2 98%  GENERAL: Well-developed, well-nourished female in no acute distress.  HEAD: Normocephalic, atraumatic.  CHEST: Normal effort of breathing, regular heart rate ABDOMEN: nontender, gravid, palpable contraction at time of exam  Cervical exam: deferred due to ROM     Fetal Monitoring: Baseline: 130 Variability: moderate Accelerations: present Decelerations: absent Contractions: q2-3 minutes  Results for orders placed or performed during the hospital encounter of 07/03/24 (from the past 24 hours)  Fern Test     Status: Abnormal   Collection Time: 07/03/24  9:35 AM  Result Value Ref Range   POCT Fern Test Positive = ruptured amniotic membanes      A: SIUP at [redacted]w[redacted]d  SROM  P: Admit to L&D  Alena Morrison, Elio, MD 07/03/2024 9:49 AM  GME ATTESTATION:  Evaluation and management procedures were performed by the [facility] Medicine Resident under my supervision. I was immediately available for direct supervision, assistance and direction throughout this encounter.  I also confirm that I have verified the information documented in the resident's note, and that I have also personally reperformed the pertinent components of the physical exam and all of the medical decision making activities.  I have also made any necessary editorial changes.  Mardy Shropshire, MD OB Fellow, Faculty Practice [facility], Center for [facility] 07/03/2024 10:44 AM

## 2024-07-03 NOTE — Anesthesia Procedure Notes (Signed)
 Epidural Patient location during procedure: OB Start time: 07/03/2024 9:37 PM End time: 07/03/2024 9:42 PM  Staffing Performed: other anesthesia staff   Preanesthetic Checklist Completed: patient identified, IV checked, site marked, risks and benefits discussed, surgical consent, monitors and equipment checked, pre-op evaluation and timeout performed  Epidural Patient position: sitting Prep: DuraPrep and site prepped and draped Patient monitoring: continuous pulse ox and blood pressure Approach: midline Location: L4-L5 Injection technique: LOR air  Needle:  Needle type: Tuohy  Needle gauge: 17 G Needle length: 9 cm and 9 Needle insertion depth: 6 cm Catheter type: closed end flexible Catheter size: 19 Gauge Catheter at skin depth: 12 cm Test dose: negative  Assessment Events: blood not aspirated, no cerebrospinal fluid, injection not painful, no injection resistance, no paresthesia and negative IV test

## 2024-07-03 NOTE — H&P (Signed)
 OBSTETRIC ADMISSION HISTORY AND PHYSICAL  Jane Padilla is a 29 y.o. female G1P0 with IUP at [redacted]w[redacted]d by 19 week US  presenting for PROM on 7/6 @ 0600. She reports +FMs, No LOF, no VB, no blurry vision, headaches or peripheral edema, and RUQ pain.  She plans on breast feeding. She request unsure for birth control. She received her prenatal care at [facility]   Dating: By 19 week US  --->  Estimated Date of Delivery: 06/30/24  Sono:   @[redacted]w[redacted]d , CWD, normal female anatomy, cephalic presentation, posterior placental lie, 1453 gm 3 lb 3 oz 39 % EFW   Prenatal History/Complications:  - GBS positive - Hx of HSV, no outbreaks on Valtrex  - IBS-C  Past Medical History: Past Medical History:  Diagnosis Date   Anemia    Vaginal Pap smear, abnormal     Past Surgical History: Past Surgical History:  Procedure Laterality Date   OVARIAN CYST REMOVAL      Obstetrical History: OB History     Gravida  1   Para      Term      Preterm      AB      Living         SAB      IAB      Ectopic      Multiple      Live Births              Social History Social History   Socioeconomic History   Marital status: Single    Spouse name: Not on file   Number of children: Not on file   Years of education: Not on file   Highest education level: Not on file  Occupational History   Not on file  Tobacco Use   Smoking status: Never   Smokeless tobacco: Never  Vaping Use   Vaping status: Never Used  Substance and Sexual Activity   Alcohol use: Yes    Comment: socially   Drug use: Never   Sexual activity: Yes    Partners: Male    Birth control/protection: None  Other Topics Concern   Not on file  Social History Narrative   Not on file   Social Drivers of Health   Financial Resource Strain: Not on file  Food Insecurity: No Food Insecurity (07/03/2024)   Hunger Vital Sign    Worried About Running Out of Food in the Last Year: Never true    Ran Out of Food in the Last Year: Never  true  Transportation Needs: No Transportation Needs (07/03/2024)   PRAPARE - Administrator, Civil Service (Medical): No    Lack of Transportation (Non-Medical): No  Physical Activity: Not on file  Stress: Not on file  Social Connections: Unknown (05/12/2022)   Received from [facility]   Social Network    Social Network: Not on file    Family History: Family History  Problem Relation Age of Onset   Hypertension Mother    Heart disease Father    Diabetes Maternal Aunt    Lung cancer Maternal Uncle    Prostate cancer Maternal Grandfather    Stroke Paternal Grandmother     Allergies: Allergies  Allergen Reactions   Red Dye Rash    Face gets red and breaks out.  Face gets red and breaks out.  Face gets red and breaks out.  Face gets red and breaks out.  Face gets red and breaks out.  Face gets red  and breaks out.   Red Dye #40 (Allura Red) Rash    Face gets red and breaks out.    Medications Prior to Admission  Medication Sig Dispense Refill Last Dose/Taking   ondansetron  (ZOFRAN -ODT) 4 MG disintegrating tablet Take 1 tablet (4 mg total) by mouth every 6 (six) hours as needed for nausea or vomiting. 30 tablet 2 Past Week   valACYclovir  (VALTREX ) 500 MG tablet Take 1 tablet (500 mg total) by mouth 2 (two) times daily. Start suppression at 36 weeks 60 tablet 6 Past Week   diphenhydrAMINE  (BENADRYL  CHILDRENS ALLERGY) 12.5 MG/5ML liquid Take 5 mLs (12.5 mg total) by mouth 4 (four) times daily as needed. (Patient not taking: Reported on 06/21/2024) 118 mL 0    Doxylamine-Pyridoxine 10-10 MG TBEC Take 2 tablets by mouth at bedtime. (Patient not taking: Reported on 06/21/2024)      ferrous sulfate  325 (65 FE) MG EC tablet Take 1 tablet (325 mg total) by mouth every other day. (Patient not taking: Reported on 06/21/2024) 30 tablet 3    Magnesium  Oxide -Mg Supplement (MAG-OXIDE) 200 MG TABS Take 2 tablets (400 mg total) by mouth at bedtime. If that amount causes  loose stools in the am, switch to 200mg  daily at bedtime. (Patient not taking: Reported on 06/21/2024) 60 tablet 3    Prenatal Vit-Fe Fumarate-FA (PRENATAL MULTIVITAMIN) TABS tablet Take 1 tablet by mouth daily at 12 noon. (Patient not taking: Reported on 06/28/2024)        Review of Systems   All systems reviewed and negative except as stated in HPI  Blood pressure 130/83, pulse (!) 106, temperature 98.6 F (37 C), temperature source Oral, resp. rate 16, last menstrual period 10/01/2023, SpO2 98%. General appearance: alert, cooperative, appears stated age, and no distress Lungs: clear to auscultation bilaterally Heart: regular rate and rhythm Abdomen: soft, non-tender; bowel sounds normal Pelvic: adequate, unproven Extremities: Homans sign is negative, no sign of DVT DTR's 2+ Presentation: cephalic Fetal monitoringBaseline: 135 bpm, Variability: Good {> 6 bpm), Accelerations: Reactive, and Decelerations: Absent Uterine activityFrequency: Every 2-4 minutes     Prenatal labs: ABO, Rh:   Antibody:   Rubella:   RPR: Non Reactive (04/14 0937)  HBsAg:    HIV: Non Reactive (04/14 0937)  GBS: Positive/-- (06/12 1250)    Lab Results  Component Value Date   GBS Positive (A) 06/08/2024   GTT wnl Genetic screening  LR female, normal AFP Anatomy US  normal  Immunization History  Administered Date(s) Administered   DTP 10/31/1996, 01/01/1997, 03/07/1997, 12/04/1997   DTaP 10/31/1996, 01/01/1997, 03/07/1997, 12/04/1997, 10/13/2000   Dtap, Unspecified 10/13/2000   HIB, Unspecified 10/31/1996, 01/01/1997, 03/07/1997, 09/05/1997   HPV Quadrivalent 08/15/2008, 08/16/2009, 07/02/2010   Hep B, Unspecified Dec [birth year], 1996, 09/28/1996, 03/07/1997   Hepatitis A, Adult 08/05/2007, 08/15/2008   IPV 10/31/1996, 01/01/1997, 09/04/1998, 10/13/2000   Influenza,Quad,Nasal, Live 09/11/2013   Influenza,inj,quad, With Preservative 10/20/2018   Influenza-Unspecified 10/20/2018   MMR 09/05/1997, 10/13/2000    Meningococcal Acwy, Unspecified 08/05/2007   Meningococcal B, Unspecified 08/05/2007   Meningococcal Conjugate 08/05/2007, 09/11/2013, 09/11/2013   OPV 09/05/1997, 03/27/1998   PFIZER(Purple Top)SARS-COV-2 Vaccination 03/16/2020, 04/06/2020   Polio, Unspecified 03/27/1998, 10/13/2000   Tdap 08/05/2007, 09/16/2021, 04/27/2024   Varicella 09/05/1997, 08/05/2007    Prenatal Transfer Tool  Maternal Diabetes: No Genetic Screening: Normal Maternal Ultrasounds/Referrals: Normal Fetal Ultrasounds or other Referrals:  None Maternal Substance Abuse:  No Significant Maternal Medications:  None Significant Maternal Lab Results: Group B Strep positive Number of Prenatal Visits:greater  than 3 verified prenatal visits Maternal Vaccinations:TDap Other Comments:  None   Results for orders placed or performed during the hospital encounter of 07/03/24 (from the past 24 hours)  Fern Test   Collection Time: 07/03/24  9:35 AM  Result Value Ref Range   POCT Fern Test Positive = ruptured amniotic membanes     Patient Active Problem List   Diagnosis Date Noted   Group B Streptococcus carrier, antepartum 06/11/2024   Pruritus of pregnancy 04/27/2024   Supervision of low-risk pregnancy 02/10/2024   Sickle cell trait (HCC) 02/10/2024   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   Irritable bowel syndrome with constipation 09/16/2021   Rosacea 09/16/2021   Migraines 08/02/2014   Ovarian torsion 08/02/2014    Assessment/Plan:  Darnisha Vernet Gilbo is a 28 y.o. G1P0 at [redacted]w[redacted]d here for PROM 7/6 @ 0600  #Labor:Expectant management for now, likely Pitocin  augmentation given prolonged ROM #Pain: Per pt request #FWB: Cat I #GBS status:  Positive; PCN #Feeding: Breastmilk  #Reproductive Life planning: Undecided #Circ:  yes  #HSV No outbreaks, on suppression.  Mardy Shropshire, MD  07/03/2024, 9:37 AM

## 2024-07-03 NOTE — Anesthesia Preprocedure Evaluation (Addendum)
 Anesthesia Evaluation  Patient identified by MRN, date of birth, ID band Patient awake    Reviewed: Allergy & Precautions, H&P , NPO status , Patient's Chart, lab work & pertinent test results, reviewed documented beta blocker date and time   Airway Mallampati: II  TM Distance: >3 FB Neck ROM: full    Dental no notable dental hx. (+) Dental Advisory Given   Pulmonary neg pulmonary ROS   Pulmonary exam normal breath sounds clear to auscultation       Cardiovascular negative cardio ROS Normal cardiovascular exam Rhythm:regular Rate:Normal     Neuro/Psych  Headaches  negative psych ROS   GI/Hepatic negative GI ROS, Neg liver ROS,,,  Endo/Other    Class 3 obesity  Renal/GU negative Renal ROS  negative genitourinary   Musculoskeletal   Abdominal   Peds  Hematology  (+) Blood dyscrasia, anemia   Anesthesia Other Findings   Reproductive/Obstetrics (+) Pregnancy                              Anesthesia Physical Anesthesia Plan  ASA: 2  Anesthesia Plan: Epidural   Post-op Pain Management: Minimal or no pain anticipated   Induction: Intravenous  PONV Risk Score and Plan: 2  Airway Management Planned: Natural Airway  Additional Equipment: None  Intra-op Plan:   Post-operative Plan:   Informed Consent: I have reviewed the patients History and Physical, chart, labs and discussed the procedure including the risks, benefits and alternatives for the proposed anesthesia with the patient or authorized representative who has indicated his/her understanding and acceptance.       Plan Discussed with: Anesthesiologist and CRNA  Anesthesia Plan Comments:          Anesthesia Quick Evaluation

## 2024-07-03 NOTE — Progress Notes (Signed)
 LABOR PROGRESS NOTE  Patient Name: Jane Padilla, female   DOB: Sep 14, 1996, 28 y.o.  MRN: [id number]  Called to patient room given prolong deceleration.  Prior to my arrival Pit of 2 turned completely off, peripheral bolus started and mom moved to hands and knees.  FHT back to 110s and what appeared to be tachysystole prior to decel when at bedside so moved mom to side and ordered terb.  Terb x 1 given after mom counseled / consented.  Will defer check given prolong rupture and high infection risk.  Will give mom and fetus time to recover prior to starting back pit 1x1 if not contracting on her own.  If not able to start pit and pt unchanged at next check will consider FB.  Pt agreeable / understanding.  No further questions / concerns at this time.   Augustin JAYSON Slade, MD

## 2024-07-03 NOTE — Progress Notes (Addendum)
 LABOR PROGRESS NOTE  Patient Name: Jane Padilla, female   DOB: 1996-03-18, 28 y.o.  MRN: [id number]  Spoke with patient at bedside and pt thought valtrex  was making her sick so she did not take the medication as prescribed missing doses regularly.  Her last outbreak was 12/2023 before her initial outbreak 04/2023. Denies prodromal symptoms, agreeable to speculum exam, negative speculum exam.  Pt also states she had SROM at 0400 7/6 meaning ruptured for 31hrs.  She had hoped to not be induced or have much augmentation but SVE FT/thick/-3 still so after shared decision making she agreed to start low dose pitocin .  Cat 1 strip.  Pit titration PRN.    Augustin JAYSON Slade, MD

## 2024-07-04 ENCOUNTER — Other Ambulatory Visit: Payer: Self-pay

## 2024-07-04 ENCOUNTER — Encounter (HOSPITAL_COMMUNITY): Payer: Self-pay | Admitting: Family Medicine

## 2024-07-04 ENCOUNTER — Encounter (HOSPITAL_COMMUNITY)
Admission: RE | Disposition: A | Discharge status: Home / Self Care | Payer: Self-pay | Source: Home / Self Care | Attending: Family Medicine

## 2024-07-04 DIAGNOSIS — Z3A40 40 weeks gestation of pregnancy: Secondary | ICD-10-CM

## 2024-07-04 DIAGNOSIS — O99820 Streptococcus B carrier state complicating pregnancy: Secondary | ICD-10-CM | POA: Diagnosis not present

## 2024-07-04 DIAGNOSIS — O4202 Full-term premature rupture of membranes, onset of labor within 24 hours of rupture: Secondary | ICD-10-CM | POA: Diagnosis not present

## 2024-07-04 DIAGNOSIS — O480 Post-term pregnancy: Secondary | ICD-10-CM | POA: Diagnosis not present

## 2024-07-04 LAB — COMPREHENSIVE METABOLIC PANEL WITH GFR
ALT: 10 U/L (ref 0–44)
AST: 21 U/L (ref 15–41)
Albumin: 2.4 g/dL — ABNORMAL LOW (ref 3.5–5.0)
Alkaline Phosphatase: 124 U/L (ref 38–126)
Anion gap: 10 (ref 5–15)
BUN: 10 mg/dL (ref 6–20)
CO2: 19 mmol/L — ABNORMAL LOW (ref 22–32)
Calcium: 8.7 mg/dL — ABNORMAL LOW (ref 8.9–10.3)
Chloride: 106 mmol/L (ref 98–111)
Creatinine, Ser: 1.22 mg/dL — ABNORMAL HIGH (ref 0.44–1.00)
GFR, Estimated: >60 mL/min (ref >60)
Glucose, Bld: 100 mg/dL — ABNORMAL HIGH (ref 70–99)
Potassium: 4.6 mmol/L (ref 3.5–5.1)
Sodium: 135 mmol/L (ref 135–145)
Total Bilirubin: 0.7 mg/dL (ref 0.0–1.2)
Total Protein: 6 g/dL — ABNORMAL LOW (ref 6.5–8.1)

## 2024-07-04 LAB — CBC
HCT: 28.4 % — ABNORMAL LOW (ref 36.0–46.0)
Hemoglobin: 9.3 g/dL — ABNORMAL LOW (ref 12.0–15.0)
MCH: 25.5 pg — ABNORMAL LOW (ref 26.0–34.0)
MCHC: 32.7 g/dL (ref 30.0–36.0)
MCV: 77.8 fL — ABNORMAL LOW (ref 80.0–100.0)
Platelets: 164 K/uL (ref 150–400)
RBC: 3.65 MIL/uL — ABNORMAL LOW (ref 3.87–5.11)
RDW: 15.9 % — ABNORMAL HIGH (ref 11.5–15.5)
WBC: 11.2 K/uL — ABNORMAL HIGH (ref 4.0–10.5)
nRBC: 0 % (ref 0.0–0.2)

## 2024-07-04 LAB — PROTEIN / CREATININE RATIO, URINE
Creatinine, Urine: 175 mg/dL
Protein Creatinine Ratio: 0.17 mg/mg{creat} — ABNORMAL HIGH (ref 0.00–0.15)
Total Protein, Urine: 29 mg/dL

## 2024-07-04 SURGERY — Surgical Case
Anesthesia: Epidural

## 2024-07-04 MED ORDER — NALOXONE HCL 0.4 MG/ML IJ SOLN: 0.4000 mg | INTRAMUSCULAR | Status: DC | PRN

## 2024-07-04 MED ORDER — ACETAMINOPHEN 10 MG/ML IV SOLN: 1000.0000 mg | Freq: Once | INTRAVENOUS | Status: DC | PRN

## 2024-07-04 MED ORDER — OXYTOCIN-SODIUM CHLORIDE 30-0.9 UT/500ML-% IV SOLN
1.0000 m[IU]/min | INTRAVENOUS | Status: DC
  Administered 2024-07-04: 1 m[IU]/min via INTRAVENOUS

## 2024-07-04 MED ORDER — OXYCODONE HCL 5 MG PO TABS: 5.0000 mg | ORAL_TABLET | Freq: Once | ORAL | Status: DC | PRN

## 2024-07-04 MED ORDER — MAGNESIUM HYDROXIDE 400 MG/5ML PO SUSP
30.0000 mL | ORAL | Status: DC | PRN
Start: 2024-07-04 — End: 2024-07-07

## 2024-07-04 MED ORDER — KETOROLAC TROMETHAMINE 30 MG/ML IJ SOLN: 30.0000 mg | Freq: Four times a day (QID) | INTRAMUSCULAR | Status: AC | PRN

## 2024-07-04 MED ORDER — OXYCODONE HCL 5 MG PO TABS: 10.0000 mg | ORAL_TABLET | ORAL | Status: DC | PRN

## 2024-07-04 MED ORDER — DROPERIDOL 2.5 MG/ML IJ SOLN
0.6250 mg | Freq: Once | INTRAMUSCULAR | Status: DC | PRN
Start: 2024-07-04 — End: 2024-07-04

## 2024-07-04 MED ORDER — OXYTOCIN-SODIUM CHLORIDE 30-0.9 UT/500ML-% IV SOLN: 2.5000 [IU]/h | INTRAVENOUS | Status: AC

## 2024-07-04 MED ORDER — DIPHENHYDRAMINE HCL 50 MG/ML IJ SOLN
12.5000 mg | INTRAMUSCULAR | Status: DC | PRN
Start: 2024-07-04 — End: 2024-07-06

## 2024-07-04 MED ORDER — CEFAZOLIN SODIUM-DEXTROSE 2-4 GM/100ML-% IV SOLN: 2.0000 g | INTRAVENOUS | Status: DC

## 2024-07-04 MED ORDER — FENTANYL CITRATE (PF) 100 MCG/2ML IJ SOLN
INTRAMUSCULAR | Status: AC
  Filled 2024-07-04: qty 2

## 2024-07-04 MED ORDER — FENTANYL CITRATE (PF) 100 MCG/2ML IJ SOLN
INTRAMUSCULAR | Status: AC
Start: 2024-07-04 — End: 2024-07-04
  Filled 2024-07-04: qty 2

## 2024-07-04 MED ORDER — SIMETHICONE 80 MG PO CHEW: 80.0000 mg | CHEWABLE_TABLET | ORAL | Status: DC | PRN

## 2024-07-04 MED ORDER — LIDOCAINE-EPINEPHRINE (PF) 2 %-1:200000 IJ SOLN
INTRAMUSCULAR | Status: DC | PRN
  Administered 2024-07-04: 2 mL via EPIDURAL
  Administered 2024-07-04 (×3): 3 mL via EPIDURAL
  Administered 2024-07-04: 2 mL via EPIDURAL

## 2024-07-04 MED ORDER — LACTATED RINGERS IV SOLN
INTRAVENOUS | Status: DC | PRN
Start: 2024-07-04 — End: 2024-07-04

## 2024-07-04 MED ORDER — TRANEXAMIC ACID-NACL 1000-0.7 MG/100ML-% IV SOLN
INTRAVENOUS | Status: DC | PRN
  Administered 2024-07-04: 1000 mg via INTRAVENOUS

## 2024-07-04 MED ORDER — OXYTOCIN-SODIUM CHLORIDE 30-0.9 UT/500ML-% IV SOLN
INTRAVENOUS | Status: DC | PRN
  Administered 2024-07-04: 300 mL via INTRAVENOUS

## 2024-07-04 MED ORDER — DEXAMETHASONE SODIUM PHOSPHATE 4 MG/ML IJ SOLN
INTRAMUSCULAR | Status: DC | PRN
  Administered 2024-07-04: 10 mg via INTRAVENOUS

## 2024-07-04 MED ORDER — SODIUM CHLORIDE 0.9 % IV SOLN
INTRAVENOUS | Status: DC | PRN
  Administered 2024-07-04: 500 mg via INTRAVENOUS

## 2024-07-04 MED ORDER — MENTHOL 3 MG MT LOZG
1.0000 | LOZENGE | OROMUCOSAL | Status: DC | PRN
Start: 2024-07-04 — End: 2024-07-07

## 2024-07-04 MED ORDER — ONDANSETRON HCL 4 MG/2ML IJ SOLN
INTRAMUSCULAR | Status: DC | PRN
  Administered 2024-07-04: 4 mg via INTRAVENOUS

## 2024-07-04 MED ORDER — FENTANYL CITRATE (PF) 100 MCG/2ML IJ SOLN
INTRAMUSCULAR | Status: DC | PRN
  Administered 2024-07-04: 100 ug via EPIDURAL

## 2024-07-04 MED ORDER — TRANEXAMIC ACID-NACL 1000-0.7 MG/100ML-% IV SOLN: 1000.0000 mg | Freq: Once | INTRAVENOUS | Status: DC

## 2024-07-04 MED ORDER — SODIUM CHLORIDE 0.9% FLUSH: 3.0000 mL | INTRAVENOUS | Status: DC | PRN

## 2024-07-04 MED ORDER — SCOPOLAMINE 1 MG/3DAYS TD PT72: 1.0000 | MEDICATED_PATCH | Freq: Once | TRANSDERMAL | Status: DC

## 2024-07-04 MED ORDER — SIMETHICONE 80 MG PO CHEW
80.0000 mg | CHEWABLE_TABLET | Freq: Three times a day (TID) | ORAL | Status: DC
  Administered 2024-07-04 – 2024-07-06 (×6): 80 mg via ORAL
  Filled 2024-07-04 (×7): qty 1

## 2024-07-04 MED ORDER — CEFAZOLIN SODIUM-DEXTROSE 2-3 GM-%(50ML) IV SOLR
INTRAVENOUS | Status: DC | PRN
  Administered 2024-07-04: 2 g via INTRAVENOUS

## 2024-07-04 MED ORDER — ONDANSETRON HCL 4 MG/2ML IJ SOLN: 4.0000 mg | Freq: Three times a day (TID) | INTRAMUSCULAR | Status: DC | PRN

## 2024-07-04 MED ORDER — DEXAMETHASONE SODIUM PHOSPHATE 10 MG/ML IJ SOLN
INTRAMUSCULAR | Status: AC
  Filled 2024-07-04: qty 1

## 2024-07-04 MED ORDER — SOD CITRATE-CITRIC ACID 500-334 MG/5ML PO SOLN: 30.0000 mL | ORAL | Status: DC

## 2024-07-04 MED ORDER — FENTANYL CITRATE (PF) 100 MCG/2ML IJ SOLN
25.0000 ug | INTRAMUSCULAR | Status: DC | PRN
  Administered 2024-07-04: 25 ug via INTRAVENOUS

## 2024-07-04 MED ORDER — KETOROLAC TROMETHAMINE 30 MG/ML IJ SOLN
INTRAMUSCULAR | Status: AC
  Filled 2024-07-04: qty 1

## 2024-07-04 MED ORDER — ENOXAPARIN SODIUM 40 MG/0.4ML IJ SOSY
40.0000 mg | PREFILLED_SYRINGE | INTRAMUSCULAR | Status: DC
  Administered 2024-07-05 – 2024-07-06 (×2): 40 mg via SUBCUTANEOUS
  Filled 2024-07-04 (×2): qty 0.4

## 2024-07-04 MED ORDER — PHENYLEPHRINE 80 MCG/ML (10ML) SYRINGE FOR IV PUSH (FOR BLOOD PRESSURE SUPPORT)
PREFILLED_SYRINGE | INTRAVENOUS | Status: DC | PRN
  Administered 2024-07-04 (×2): 160 ug via INTRAVENOUS

## 2024-07-04 MED ORDER — DEXMEDETOMIDINE HCL IN NACL 200 MCG/50ML IV SOLN
INTRAVENOUS | Status: DC | PRN
  Administered 2024-07-04 (×3): 8 ug via INTRAVENOUS

## 2024-07-04 MED ORDER — MEPERIDINE HCL 25 MG/ML IJ SOLN: 6.2500 mg | INTRAMUSCULAR | Status: DC | PRN

## 2024-07-04 MED ORDER — MORPHINE SULFATE (PF) 0.5 MG/ML IJ SOLN
INTRAMUSCULAR | Status: DC | PRN
  Administered 2024-07-04: 3 mg via EPIDURAL

## 2024-07-04 MED ORDER — STERILE WATER FOR IRRIGATION IR SOLN
Status: DC | PRN
  Administered 2024-07-04: 1000 mL

## 2024-07-04 MED ORDER — SODIUM CHLORIDE 0.9 % IV SOLN: 500.0000 mg | INTRAVENOUS | Status: DC

## 2024-07-04 MED ORDER — DEXMEDETOMIDINE HCL IN NACL 80 MCG/20ML IV SOLN
INTRAVENOUS | Status: AC
  Filled 2024-07-04: qty 20

## 2024-07-04 MED ORDER — OXYCODONE HCL 5 MG/5ML PO SOLN: 5.0000 mg | Freq: Once | ORAL | Status: DC | PRN

## 2024-07-04 MED ORDER — NALOXONE HCL 4 MG/10ML IJ SOLN: 1.0000 ug/kg/h | INTRAVENOUS | Status: DC | PRN

## 2024-07-04 MED ORDER — DIBUCAINE (PERIANAL) 1 % EX OINT
1.0000 | TOPICAL_OINTMENT | CUTANEOUS | Status: DC | PRN
Start: 2024-07-04 — End: 2024-07-07

## 2024-07-04 MED ORDER — MORPHINE SULFATE (PF) 0.5 MG/ML IJ SOLN
INTRAMUSCULAR | Status: AC
  Filled 2024-07-04: qty 10

## 2024-07-04 MED ORDER — KETOROLAC TROMETHAMINE 30 MG/ML IJ SOLN
30.0000 mg | Freq: Four times a day (QID) | INTRAMUSCULAR | Status: AC | PRN
Start: 2024-07-04 — End: 2024-07-05
  Administered 2024-07-04 – 2024-07-05 (×3): 30 mg via INTRAVENOUS
  Filled 2024-07-04 (×2): qty 1

## 2024-07-04 MED ORDER — ACETAMINOPHEN 325 MG PO TABS
650.0000 mg | ORAL_TABLET | ORAL | Status: DC | PRN
  Administered 2024-07-04 – 2024-07-06 (×8): 650 mg via ORAL
  Filled 2024-07-04 (×8): qty 2

## 2024-07-04 MED ORDER — ACETAMINOPHEN 10 MG/ML IV SOLN
INTRAVENOUS | Status: DC | PRN
  Administered 2024-07-04: 1000 mg via INTRAVENOUS

## 2024-07-04 MED ORDER — COCONUT OIL OIL
1.0000 | TOPICAL_OIL | Status: DC | PRN
Start: 2024-07-04 — End: 2024-07-07

## 2024-07-04 MED ORDER — DIPHENHYDRAMINE HCL 25 MG PO CAPS: 25.0000 mg | ORAL_CAPSULE | ORAL | Status: DC | PRN

## 2024-07-04 MED ORDER — OXYCODONE HCL 5 MG PO TABS
5.0000 mg | ORAL_TABLET | ORAL | Status: DC | PRN
  Administered 2024-07-05 – 2024-07-06 (×4): 5 mg via ORAL
  Filled 2024-07-04 (×4): qty 1

## 2024-07-04 MED ORDER — WITCH HAZEL-GLYCERIN EX PADS: 1.0000 | MEDICATED_PAD | CUTANEOUS | Status: DC | PRN

## 2024-07-04 SURGICAL SUPPLY — 33 items
BENZOIN TINCTURE PRP APPL 2/3 (GAUZE/BANDAGES/DRESSINGS) IMPLANT
CHLORAPREP W/TINT 26 (MISCELLANEOUS) ×2 IMPLANT
CLAMP UMBILICAL CORD (MISCELLANEOUS) ×1 IMPLANT
CLOTH BEACON ORANGE TIMEOUT ST (SAFETY) ×1 IMPLANT
DERMABOND ADVANCED .7 DNX12 (GAUZE/BANDAGES/DRESSINGS) IMPLANT
DRSG OPSITE POSTOP 4X10 (GAUZE/BANDAGES/DRESSINGS) ×1 IMPLANT
ELECTRODE REM PT RTRN 9FT ADLT (ELECTROSURGICAL) ×1 IMPLANT
EXTRACTOR VACUUM BELL STYLE (SUCTIONS) IMPLANT
GAUZE PAD ABD 7.5X8 STRL (GAUZE/BANDAGES/DRESSINGS) IMPLANT
GAUZE SPONGE 4X4 12PLY STRL LF (GAUZE/BANDAGES/DRESSINGS) IMPLANT
GLOVE BIOGEL PI IND STRL 6.5 (GLOVE) ×2 IMPLANT
GLOVE ECLIPSE 6.5 STRL STRAW (GLOVE) ×2 IMPLANT
GOWN STRL REUS W/TWL LRG LVL3 (GOWN DISPOSABLE) ×3 IMPLANT
HEMOSTAT SURGICEL 2X14 (HEMOSTASIS) IMPLANT
KIT ABG SYR 3ML LUER SLIP (SYRINGE) IMPLANT
MAT PREVALON FULL STRYKER (MISCELLANEOUS) IMPLANT
NDL HYPO 25X1 1.5 SAFETY (NEEDLE) IMPLANT
NEEDLE HYPO 22GX1.5 SAFETY (NEEDLE) ×1 IMPLANT
NEEDLE HYPO 25X1 1.5 SAFETY (NEEDLE) IMPLANT
NS IRRIG 1000ML POUR BTL (IV SOLUTION) ×1 IMPLANT
PACK C SECTION WH (CUSTOM PROCEDURE TRAY) ×1 IMPLANT
PAD OB MATERNITY 4.3X12.25 (PERSONAL CARE ITEMS) ×1 IMPLANT
SPONGE T-LAP 18X18 ~~LOC~~+RFID (SPONGE) IMPLANT
STRIP CLOSURE SKIN 1/2X4 (GAUZE/BANDAGES/DRESSINGS) IMPLANT
SUT MON AB 4-0 PS1 27 (SUTURE) ×1 IMPLANT
SUT PLAIN ABS 2-0 CT1 27XMFL (SUTURE) ×1 IMPLANT
SUT VIC AB 0 CT1 36 (SUTURE) ×2 IMPLANT
SUT VIC AB 0 CTX36XBRD ANBCTRL (SUTURE) ×1 IMPLANT
SUT VIC AB 4-0 KS 27 (SUTURE) IMPLANT
SYR CONTROL 10ML LL (SYRINGE) ×1 IMPLANT
TOWEL OR 17X24 6PK STRL BLUE (TOWEL DISPOSABLE) ×1 IMPLANT
TRAY FOLEY W/BAG SLVR 14FR LF (SET/KITS/TRAYS/PACK) ×1 IMPLANT
WATER STERILE IRR 1000ML POUR (IV SOLUTION) ×1 IMPLANT

## 2024-07-04 NOTE — Progress Notes (Signed)
 Patient ID: Jane Padilla, female   DOB: [birth year], 28 y.o.   MRN: [id number]   Subjective: Nurse call reports patient with prolonged deceleration that is now resolving. Strip and Chart Reviewed.  Objective:  Vitals:   07/03/24 2331 07/04/24 0002 07/04/24 0031 07/04/24 0105  BP: (!) 141/52 131/70 119/83   Pulse: 75 62 72   Resp:      Temp:    98.8 F (37.1 C)  TempSrc:    Oral  SpO2:        FHR: 135 bpm, Mod Var, Prolonged Decel, -Accels UC: None Graphed deceleration with improving FHR noted at .   Assessment: IUP at [redacted]w[redacted]d Cat II FT  Plan: -Instructed to give O2 now, but to place FSE if deceleration occurs again.  -Will also consider terbutaline . -Will continue to monitor and reassess.   DOROTHA Duncans, CNM 07/04/2024 1:34 AM

## 2024-07-04 NOTE — Progress Notes (Signed)
 Faculty Note  S: In to see patient with Dr. Von.   O: BP 109/62   Pulse 89   Temp 99.2 F (37.3 C) (Oral)   Resp 18   LMP 10/01/2023   SpO2 100%   Gen: alert, oriented SVE: 9/90/-2  FHT: 140s bpm, moderate variability, accels occasionally present, some early decels Toco: ctx every 2-4 min   A/P: Pt is 28 y.o. G1P0 @ [redacted]w[redacted]d who is admitted for PROM. Has been augmented with pitocin  but had to be stopped several times due to fetal tracing. Has been 9 cm since 9 pm last night with no progress. Dr. Von rechecked and she remains 9 this am, recommended proceeding with c-section due to inability to augment for fetal tracing and no progression.   The risks of cesarean section were discussed with the patient; including but not limited to: infection which may require antibiotics; bleeding which may require transfusion or re-operation; injury to bowel, bladder, ureters or other surrounding organs; need for additional procedures including hysterectomy in the event of a life-threatening hemorrhage; placental abnormalities wth subsequent pregnancies,  risk of needing c-sections in future pregnancies, incisional problems, thromboembolic phenomenon and other postoperative/anesthesia complications. Answered all questions. The patient verbalized understanding of the plan but would like to consider her options. Pitocin  turned off.     K. Yolanda Moats, MD, [facility] Attending Center for [facility])

## 2024-07-04 NOTE — Transfer of Care (Signed)
 Immediate Anesthesia Transfer of Care Note  Patient: Jane Padilla  Procedure(s) Performed: CESAREAN DELIVERY  Patient Location: PACU  Anesthesia Type:Epidural  Level of Consciousness: awake  Airway & Oxygen Therapy: Patient Spontanous Breathing  Post-op Assessment: Report given to RN  Post vital signs: Reviewed and stable  Last Vitals:  Vitals Value Taken Time  BP 105/60 07/04/24 11:22  Temp    Pulse 90 07/04/24 11:25  Resp 22 07/04/24 11:25  SpO2 97 % 07/04/24 11:25  Vitals shown include unfiled device data.  Last Pain:  Vitals:   07/04/24 0900  TempSrc:   PainSc: 0-No pain      Patients Stated Pain Goal: 0 (07/03/24 0912)  Complications: No notable events documented.

## 2024-07-04 NOTE — Lactation Note (Addendum)
 This note was copied from a baby's chart. Lactation Consultation Note  Patient Name: Boy Lita Flynn Unijb'd Date: 07/04/2024 Age:28 hours Reason for consult: Initial assessment;Primapara;1st time breastfeeding;Term LC reviewed breast feeding basics and breast feeding goals for 24 hours - feed with feeding cues and by 3 hours STS.  LC encouraged to call for Lactation assistance and is aware the nurse can assist too.   Maternal Data Has patient been taught Hand Expression?:  (per mom familiar with hand expresssing , LC unable to show mom do to baby asleep.) Does the patient have breastfeeding experience prior to this delivery?: No  Feeding Mother's Current Feeding Choice: Breast Milk  LATCH Score - ( Latch score by nurse )  Latch: Repeated attempts needed to sustain latch, nipple held in mouth throughout feeding, stimulation needed to elicit sucking reflex.  Audible Swallowing: A few with stimulation  Type of Nipple: Everted at rest and after stimulation  Comfort (Breast/Nipple): Soft / non-tender  Hold (Positioning): Assistance needed to correctly position infant at breast and maintain latch.  LATCH Score: 7    Interventions  Education ,  Storage of breast milk  LC resources   Discharge Pump: Hands Free;Personal WIC Program: No  Consult Status Consult Status: Follow-up Date: 07/05/24 Follow-up type: In-patient    Rollene Caldron Adrijana Haros 07/04/2024, 7:17 PM

## 2024-07-04 NOTE — Discharge Summary (Addendum)
 Postpartum Discharge Summary  Date of Service updated7/10     Patient Name: Jane Padilla DOB: 05/25/1996 MRN: [id number]  Date of admission: 07/03/2024 Delivery date:07/04/2024 Delivering provider: NICHOLAUS BURNARD HERO Date of discharge: 07/06/2024  Admitting diagnosis: Indication for care in labor or delivery [O75.9] Intrauterine pregnancy: [redacted]w[redacted]d     Secondary diagnosis:  Principal Problem:   Indication for care in labor or delivery Active Problems:   Supervision of low-risk pregnancy   Sickle cell trait (HCC)   HSV infection   Pruritus of pregnancy   Group B Streptococcus carrier, antepartum   Arrest of dilation, delivered, current hospitalization   Cesarean delivery delivered   Anemia associated with acute blood loss   Leukocytosis  Additional problems:     Discharge diagnosis: Term Pregnancy Delivered                                              Post partum procedures: Venofer  infusion  Augmentation: Pitocin  Complications: None  Hospital course: Onset of Labor With Unplanned C/S   28 y.o. yo G1P1001 at [redacted]w[redacted]d was admitted in Latent Labor on 07/03/2024. Patient had a labor course significant for PROM with prolonged rupture. Was augmented with pitocin  and intermittent non-reassuring fetal status, then did not progress past 9 cm. The patient went for cesarean section due to Arrest of Dilation. Delivery details as follows: Membrane Rupture Time/Date: 4:00 AM,07/03/2024  Delivery Method:C-Section, Low Transverse Operative Delivery:N/A Details of operation can be found in separate operative note. Patient had a postpartum course complicated by leukocytosis (resolved) and moderate, symptomatic anemia.  Discussed R/B/I PO iron , blood transfusion , and iron  transfusion either inpatient or outpatient. Pt requests IP Venofer . She is ambulating,tolerating a regular diet, passing flatus, and urinating well.  Patient is discharged home in stable condition 07/06/24.  Newborn Data: Birth  date:07/04/2024 Birth time:10:28 AM Gender:Female Living status:Living Apgars:8 ,9  Weight:3570 g  Magnesium  Sulfate received: No BMZ received: No Rhophylac:N/A MMR:N/A T-DaP:Given prenatally Flu: N/A RSV Vaccine received: No Transfusion:No  Immunizations received: Immunization History  Administered Date(s) Administered   DTP 10/31/1996, 01/01/1997, 03/07/1997, 12/04/1997   DTaP 10/31/1996, 01/01/1997, 03/07/1997, 12/04/1997, 10/13/2000   Dtap, Unspecified 10/13/2000   HIB, Unspecified 10/31/1996, 01/01/1997, 03/07/1997, 09/05/1997   HPV Quadrivalent 08/15/2008, 08/16/2009, 07/02/2010   Hep B, Unspecified August 22, 1996, 09/28/1996, 03/07/1997   Hepatitis A, Adult 08/05/2007, 08/15/2008   IPV 10/31/1996, 01/01/1997, 09/04/1998, 10/13/2000   Influenza,Quad,Nasal, Live 09/11/2013   Influenza,inj,quad, With Preservative 10/20/2018   Influenza-Unspecified 10/20/2018   MMR 09/05/1997, 10/13/2000   Meningococcal Acwy, Unspecified 08/05/2007   Meningococcal B, Unspecified 08/05/2007   Meningococcal Conjugate 08/05/2007, 09/11/2013, 09/11/2013   OPV 09/05/1997, 03/27/1998   PFIZER(Purple Top)SARS-COV-2 Vaccination 03/16/2020, 04/06/2020   Polio, Unspecified 03/27/1998, 10/13/2000   Tdap 08/05/2007, 09/16/2021, 04/27/2024   Varicella 09/05/1997, 08/05/2007    Physical exam  Vitals:   07/05/24 0330 07/05/24 1537 07/05/24 2034 07/06/24 0539  BP: 134/82 138/75 114/61 121/80  Pulse: 70 (!) 101 (!) 110 75  Resp: 19 18 18 17   Temp: 98.2 F (36.8 C) 98.9 F (37.2 C) 97.9 F (36.6 C) 98 F (36.7 C)  TempSrc: Oral Oral Oral   SpO2:   100% 98%  Weight:      Height:       General: alert, cooperative, and no distress Skin: Pale Lochia: appropriate Uterine Fundus: firm Incision: Healing well  with no significant drainage, Dressing is clean, dry, and intact DVT Evaluation: No evidence of DVT seen on physical exam. Labs:    Latest Ref Rng & Units 07/06/2024   10:15 AM 07/05/2024     7:31 AM 07/04/2024    4:45 AM  CBC  WBC 4.0 - 10.5 K/uL 11.6  21.4  11.2   Hemoglobin 12.0 - 15.0 g/dL 7.3  7.8  9.3   Hematocrit 36.0 - 46.0 % 21.9  23.5  28.4   Platelets 150 - 400 K/uL 173  152  164         Latest Ref Rng & Units 07/04/2024    4:45 AM  CMP  Glucose 70 - 99 mg/dL 899   BUN 6 - 20 mg/dL 10   Creatinine 9.55 - 1.00 mg/dL 8.77   Sodium 864 - 854 mmol/L 135   Potassium 3.5 - 5.1 mmol/L 4.6   Chloride 98 - 111 mmol/L 106   CO2 22 - 32 mmol/L 19   Calcium 8.9 - 10.3 mg/dL 8.7   Total Protein 6.5 - 8.1 g/dL 6.0   Total Bilirubin 0.0 - 1.2 mg/dL 0.7   Alkaline Phos 38 - 126 U/L 124   AST 15 - 41 U/L 21   ALT 0 - 44 U/L 10    Edinburgh Score:    07/05/2024    8:08 AM  Edinburgh Postnatal Depression Scale Screening Tool  I have been able to laugh and see the funny side of things. 0  I have looked forward with enjoyment to things. 0  I have blamed myself unnecessarily when things went wrong. 1  I have been anxious or worried for no good reason. 0  I have felt scared or panicky for no good reason. 0  Things have been getting on top of me. 1  I have been so unhappy that I have had difficulty sleeping. 0  I have felt sad or miserable. 1  I have been so unhappy that I have been crying. 0  The thought of harming myself has occurred to me. 0  Edinburgh Postnatal Depression Scale Total 3   Edinburgh Postnatal Depression Scale Total: 3   After visit meds:  Allergies as of 07/06/2024       Reactions   Red Dye Rash   Face gets red and breaks out.  Face gets red and breaks out.  Face gets red and breaks out.  Face gets red and breaks out. Face gets red and breaks out.  Face gets red and breaks out.   Red Dye #40 (allura Red) Rash   Face gets red and breaks out.        Medication List     STOP taking these medications    diphenhydrAMINE  12.5 MG/5ML liquid Commonly known as: BENADRYL  CHILDRENS ALLERGY   Doxylamine-Pyridoxine 10-10 MG Tbec    Mag-Oxide 200 MG Tabs Generic drug: Magnesium  Oxide -Mg Supplement   ondansetron  4 MG disintegrating tablet Commonly known as: ZOFRAN -ODT   prenatal multivitamin Tabs tablet   valACYclovir  500 MG tablet Commonly known as: VALTREX        TAKE these medications    acetaminophen  500 MG tablet Commonly known as: TYLENOL  Take 2 tablets (1,000 mg total) by mouth every 6 (six) hours as needed for mild pain (pain score 1-3) (temperature > 101.5.).   ferrous sulfate  325 (65 FE) MG EC tablet Take 1 tablet (325 mg total) by mouth every other day.   ibuprofen  600 MG tablet Commonly  known as: ADVIL  Take 1 tablet (600 mg total) by mouth every 6 (six) hours as needed.   norethindrone  0.35 MG tablet Commonly known as: MICRONOR  Take 1 tablet (0.35 mg total) by mouth daily.   oxyCODONE  5 MG immediate release tablet Commonly known as: Oxy IR/ROXICODONE  Take 1 tablet (5 mg total) by mouth every 4 (four) hours as needed for moderate pain (pain score 4-6).   polyethylene glycol powder 17 GM/SCOOP powder Commonly known as: GLYCOLAX /MIRALAX  Take 17 g by mouth daily as needed.         Discharge home in stable condition Infant Feeding: Breast Infant Disposition:home with mother Discharge instruction: per After Visit Summary and Postpartum booklet. Activity: Advance as tolerated. Pelvic rest for 6 weeks.  Diet: routine diet Future Appointments: Future Appointments  Date Time Provider Department Center  08/09/2024  1:35 PM Vannie Cornell SAUNDERS, CNM [facility]   Follow up Visit:   Please schedule this patient for a In person postpartum visit in 1 week with the following provider: Any provider. Additional Postpartum F/U:Incision check 1 week  Low risk pregnancy complicated by: unplanned CS Delivery mode:  C-Section, Low Transverse Anticipated Birth Control:  POPs   07/06/2024 Ashayla Subia  Claudene, CNM

## 2024-07-04 NOTE — Op Note (Addendum)
 Jane Padilla PROCEDURE DATE: 07/04/24    PREOPERATIVE DIAGNOSES: Intrauterine pregnancy at [redacted]w[redacted]d weeks gestation; failure to progress: arrest of dilation at 9cm  POSTOPERATIVE DIAGNOSES: The same, viable infant delivered  PROCEDURE: Primary Low Transverse Cesarean Section  SURGEON:  Dr. MARLA Yolanda Moats   ASSISTANT:  Alain Sor, MD An experienced assistant was required given the standard of surgical care given the complexity of the case.  This assistant was needed for exposure, dissection, suctioning, retraction, instrument exchange, assisting with delivery with administration of fundal pressure, and for overall help during the procedure.  ANESTHESIOLOGY TEAM: Anesthesiologist: Tilford Franky BIRCH, MD; Mallory Manus, MD CRNA: Jeanenne Rock LABOR, CRNA Student Nurse Anesthetist: Darin Recardo LABOR, RN  INDICATIONS: Jane Padilla is a 28 y.o. G1P1001 at [redacted]w[redacted]d here for cesarean section secondary to the indications listed under preoperative diagnoses; please see preoperative note for further details.  The risks of surgery were discussed with the patient including but were not limited to: bleeding which may require transfusion or reoperation; infection which may require antibiotics; injury to bowel, bladder, ureters or other surrounding organs; injury to the fetus; need for additional procedures including hysterectomy in the event of a life-threatening hemorrhage; formation of adhesions; placental abnormalities wth subsequent pregnancies; incisional problems; thromboembolic phenomenon and other postoperative/anesthesia complications.  The patient concurred with the proposed plan, giving informed written consent for the procedure.    FINDINGS:  Viable female infant in cephalic presentation.  Apgars 8 and 9.  Amniotic fluid: clear.  Intact placenta, three vessel cord.  Normal uterus, fallopian tubes and ovaries bilaterally.  ANESTHESIA: epidural INTRAVENOUS FLUIDS: 1000 ml   ESTIMATED BLOOD  LOSS: 867 ml URINE OUTPUT:  150 ml SPECIMENS: Placenta sent to L&D COMPLICATIONS: None immediate  PROCEDURE IN DETAIL:  The patient preoperatively received intravenous antibiotics and had sequential compression devices applied to her lower extremities.  She was then taken to the operating room where the epidural was dosed up to a surgical level and found to be adequate. She was then placed in a dorsal supine position with a leftward tilt, and prepped and draped in a sterile manner.  A foley catheter was  was already in place.  After an adequate timeout was performed, a Pfannenstiel skin incision was made with scalpel and carried through to the underlying layer of fascia. The fascia was incised in the midline, and this incision was extended bluntly. The rectus muscles were separated in the midline and the peritoneum was entered bluntly.   The Alexis self-retaining retractor was introduced into the abdominal cavity. The vesicouterine peritoneum was identified and grasped using smooth pickups.  It was incised and extended laterally using the Metzenbaum scissors.  A bladder flap was then digitally created. Attention was turned to the lower uterine segment where a low transverse hysterotomy was made with a scalpel and extended bluntly in caudad and cephalad directions.  The infant was successfully delivered, the cord was clamped and cut after one minute, and the infant was handed over to the awaiting neonatology team. Uterine massage was then administered, and the placenta delivered intact with a three-vessel cord. The hysterotomy was closed with 0-Vicryl in a running fashion.  A second imbricating layer was placed with 0- Vicryl in running fashion. A figure-of-eight 0 Vicryl serosal stitch were placed to help with hemostasis.  Surgicel was placed across the hysterotomy for additional hemostasis.   The pelvis was cleared of all clot and debris. Hemostasis was confirmed on all surfaces. The uterus was inspected  and found to be hemostatic. The retractor was removed. The fascia was then closed using 0 Vicryl in a running fashion.  The subcutaneous layer was irrigated, any areas of bleeding were cauterized with the bovie,  was reapproximated with 2-0 plain gut in a running fashion, was found to be hemostatic. The skin was closed with a 4-0 Vicryl subcuticular stitch. The patient tolerated the procedure well. Sponge, instrument and needle counts were correct x 3.  She was taken to the recovery room in stable condition.   No contraindication to [facility] in future.  Alain Sor, MD FMOB Fellow, Faculty practice [facility], Center for [facility] Healthcare 07/04/24  11:11 AM   Attestation of Attending Supervision of OB Fellow: Evaluation, management, and procedures were performed by the [facility] Fellow under my supervision and collaboration. I was scrubbed and present for all portions of this procedure. I agree with the documentation and plan.  LOIS Yolanda Moats, MD, [facility] Attending Center for [employer] (Faculty Practice)  07/04/2024 3:29 PM

## 2024-07-04 NOTE — Progress Notes (Signed)
 Patient ID: Jane Padilla, female   DOB: [birth year], 28 y.o.   MRN: [id number]  Subjective: -Patient resting in bed. Reports rectal pressure that is worse with contractions. Family remains at bedside.   Objective: BP 108/63   Pulse 88   Temp 98.8 F (37.1 C) (Oral)   Resp 18   LMP 10/01/2023   SpO2 98%  No intake/output data recorded. No intake/output data recorded.  Fetal Monitoring: FHT: 135 bpm, Mod Var, -Decels, -Accels UC: palpates strong    Vaginal Exam: SVE:   Dilation: Lip/rim Effacement (%): 90 Station: -2 Exam by:: J.Latosha Gaylord, CNM Membranes:SROM x 48 hours Internal Monitors: IUPC inserted  Augmentation/Induction: Pitocin :Initiated at 40mUn/min Cytotec: None  Assessment:  IUP at 40.4 weeks Cat I FT  Prolonged ROM Afebrile Elevated BPs  Plan: -Exam performed and discussed.  -Discussed recommendation of IUPC placement. Reviewed r/b, prior to insertion, including increased risk of infection and ability to adequately monitor quantity and strength of contractions. -Patient agreeable. IUPC placed w/o incident.  -Suspect asynclitic presentation. Will perform position changes to promote rotation and descent.  -Discussed initiation of pitocin  and patient agreeable.  -Family members with questions regarding c/s and reassured that this determination will occur once all interventions fail to cause progression or in setting of unresolving fetal and/or maternal distress such as that noted with infection. Understanding verbalized.  -Dr. Abigail updated on patient status including interventions and previous tracings. No new recommendations given. -Review of chart shows elevated blood pressures c/w GHTN vs PreE. Labs ordered.   Jane Padilla, CNM Advanced Practice Provider, Center for [facility] Healthcare 07/04/2024, 4:31 AM

## 2024-07-04 NOTE — Plan of Care (Signed)
  Problem: Education: Goal: Knowledge of Childbirth will improve Outcome: Progressing Goal: Ability to make informed decisions regarding treatment and plan of care will improve Outcome: Progressing Goal: Ability to state and carry out methods to decrease the pain will improve Outcome: Progressing Goal: Individualized Educational Video(s) Outcome: Progressing   Problem: Coping: Goal: Ability to verbalize concerns and feelings about labor and delivery will improve Outcome: Progressing   Problem: Life Cycle: Goal: Ability to make normal progression through stages of labor will improve Outcome: Progressing Goal: Ability to effectively push during vaginal delivery will improve Outcome: Progressing   Problem: Role Relationship: Goal: Will demonstrate positive interactions with the child Outcome: Progressing   Problem: Safety: Goal: Risk of complications during labor and delivery will decrease Outcome: Progressing   Problem: Pain Management: Goal: Relief or control of pain from uterine contractions will improve Outcome: Progressing   Problem: Education: Goal: Knowledge of General Education information will improve Description: Including pain rating scale, medication(s)/side effects and non-pharmacologic comfort measures Outcome: Progressing   Problem: Health Behavior/Discharge Planning: Goal: Ability to manage health-related needs will improve Outcome: Progressing   Problem: Clinical Measurements: Goal: Ability to maintain clinical measurements within normal limits will improve Outcome: Progressing Goal: Will remain free from infection Outcome: Progressing Goal: Diagnostic test results will improve Outcome: Progressing Goal: Respiratory complications will improve Outcome: Progressing Goal: Cardiovascular complication will be avoided Outcome: Progressing   Problem: Activity: Goal: Risk for activity intolerance will decrease Outcome: Progressing   Problem:  Nutrition: Goal: Adequate nutrition will be maintained Outcome: Progressing   Problem: Coping: Goal: Level of anxiety will decrease Outcome: Progressing   Problem: Elimination: Goal: Will not experience complications related to bowel motility Outcome: Progressing Goal: Will not experience complications related to urinary retention Outcome: Progressing   Problem: Pain Managment: Goal: General experience of comfort will improve and/or be controlled Outcome: Progressing   Problem: Safety: Goal: Ability to remain free from injury will improve Outcome: Progressing   Problem: Skin Integrity: Goal: Risk for impaired skin integrity will decrease Outcome: Progressing

## 2024-07-04 NOTE — Progress Notes (Signed)
 Back in to discuss proceeding with c-section with patient. Questions about recovery. Answered all questions.  Again reviewed recommendation to proceed with c-section given she has been 9 cm for about 10 hours. Reassuring fetal tracing now, but have been unable to augment and pitocin  turned off overnight for fetal tracing.   The risks of cesarean section were again discussed with the patient; including but not limited to: infection which may require antibiotics; bleeding which may require transfusion or re-operation; injury to surrounding tissue and organs; injury to the fetus; need for additional procedures in the event of life threatening hemorrhage; placental abnormalities wth subsequent pregnancies,  risk of needing c-sections in future pregnancies, incisional problems, thromboembolic phenomenon and other postoperative/anesthesia complications. Answered all questions. The patient verbalized understanding of the plan, giving informed consent for the procedure. She is agreeable to blood transfusion in the event of emergency.  Patient has been NPO, she will remain NPO for procedure Anesthesia and OR aware Preoperative prophylactic antibiotics and SCDs ordered on call to the OR  To OR when ready   K. Yolanda Moats, MD, [facility] Attending Center for [facility])

## 2024-07-04 NOTE — Anesthesia Postprocedure Evaluation (Signed)
 Anesthesia Post Note  Patient: Jane Padilla  Procedure(s) Performed: CESAREAN DELIVERY     Patient location during evaluation: PACU Anesthesia Type: Epidural Level of consciousness: oriented and awake and alert Pain management: pain level controlled Vital Signs Assessment: post-procedure vital signs reviewed and stable Respiratory status: spontaneous breathing, respiratory function stable and patient connected to nasal cannula oxygen Cardiovascular status: blood pressure returned to baseline and stable Postop Assessment: no headache, no backache and no apparent nausea or vomiting Anesthetic complications: no   No notable events documented.  Last Vitals:  Vitals:   07/04/24 1220 07/04/24 1231  BP:  124/78  Pulse: 69 84  Resp: 20 18  Temp:  37.4 C  SpO2: 95% 96%    Last Pain:  Vitals:   07/04/24 1231  TempSrc: Oral  PainSc:    Pain Goal: Patients Stated Pain Goal: 0 (07/03/24 0912)                 [name]

## 2024-07-05 ENCOUNTER — Other Ambulatory Visit

## 2024-07-05 ENCOUNTER — Encounter: Payer: Self-pay | Admitting: Certified Nurse Midwife

## 2024-07-05 LAB — CBC
HCT: 23.5 % — ABNORMAL LOW (ref 36.0–46.0)
Hemoglobin: 7.8 g/dL — ABNORMAL LOW (ref 12.0–15.0)
MCH: 25.4 pg — ABNORMAL LOW (ref 26.0–34.0)
MCHC: 33.2 g/dL (ref 30.0–36.0)
MCV: 76.5 fL — ABNORMAL LOW (ref 80.0–100.0)
Platelets: 152 K/uL (ref 150–400)
RBC: 3.07 MIL/uL — ABNORMAL LOW (ref 3.87–5.11)
RDW: 15.9 % — ABNORMAL HIGH (ref 11.5–15.5)
WBC: 21.4 K/uL — ABNORMAL HIGH (ref 4.0–10.5)
nRBC: 0 % (ref 0.0–0.2)

## 2024-07-05 LAB — BIRTH TISSUE RECOVERY COLLECTION (PLACENTA DONATION)

## 2024-07-05 MED ORDER — IBUPROFEN 600 MG PO TABS
600.0000 mg | ORAL_TABLET | Freq: Four times a day (QID) | ORAL | Status: DC
  Administered 2024-07-05 – 2024-07-06 (×4): 600 mg via ORAL
  Filled 2024-07-05 (×4): qty 1

## 2024-07-05 MED ORDER — FERROUS FUMARATE 324 (106 FE) MG PO TABS
1.0000 | ORAL_TABLET | ORAL | Status: DC
  Administered 2024-07-06: 106 mg via ORAL
  Filled 2024-07-05: qty 1

## 2024-07-05 NOTE — Lactation Note (Signed)
 This note was copied from a baby's chart. Lactation Consultation Note  Patient Name: Jane Padilla Date: 07/05/2024 Age:28 hours  Attempted to see mom but she was sleeping.   Maternal Data    Feeding    LATCH Score                    Lactation Tools Discussed/Used    Interventions    Discharge    Consult Status      Jane Padilla G 07/05/2024, 10:05 PM

## 2024-07-05 NOTE — Progress Notes (Signed)
 Subjective: Postpartum Day 1: Cesarean Delivery Patient reports incisional pain, tolerating PO, and + flatus.    Objective: Vital signs in last 24 hours: Temp:  [98 F (36.7 C)-99.3 F (37.4 C)] 98.2 F (36.8 C) (07/09 0330) Pulse Rate:  [69-103] 70 (07/09 0330) Resp:  [16-20] 19 (07/09 0330) BP: (105-134)/(60-82) 134/82 (07/09 0330) SpO2:  [92 %-100 %] 100 % (07/08 1600) Weight:  [95 kg] 95 kg (07/08 1302)  Physical Exam:  General: alert, cooperative, and no distress Lochia: appropriate Uterine Fundus: firm Incision: healing well, no significant drainage DVT Evaluation: No evidence of DVT seen on physical exam.  Recent Labs    07/03/24 0952 07/04/24 0445  HGB 10.9* 9.3*  HCT 33.2* 28.4*    Assessment/Plan: Status post Cesarean section. Doing well postoperatively.  Continue current care Anticipate discharge tomorrow.  Earnie Pouch, CNM 07/05/2024, 4:27 AM

## 2024-07-05 NOTE — Plan of Care (Signed)
  Problem: Education: Goal: Knowledge of Childbirth will improve Outcome: Progressing Goal: Ability to make informed decisions regarding treatment and plan of care will improve Outcome: Progressing Goal: Ability to state and carry out methods to decrease the pain will improve Outcome: Progressing Goal: Individualized Educational Video(s) Outcome: Progressing   Problem: Coping: Goal: Ability to verbalize concerns and feelings about labor and delivery will improve Outcome: Progressing   Problem: Life Cycle: Goal: Ability to make normal progression through stages of labor will improve Outcome: Progressing Goal: Ability to effectively push during vaginal delivery will improve Outcome: Progressing   Problem: Role Relationship: Goal: Will demonstrate positive interactions with the child Outcome: Progressing   Problem: Safety: Goal: Risk of complications during labor and delivery will decrease Outcome: Progressing   Problem: Pain Management: Goal: Relief or control of pain from uterine contractions will improve Outcome: Progressing   Problem: Education: Goal: Knowledge of General Education information will improve Description: Including pain rating scale, medication(s)/side effects and non-pharmacologic comfort measures Outcome: Progressing   Problem: Health Behavior/Discharge Planning: Goal: Ability to manage health-related needs will improve Outcome: Progressing   Problem: Clinical Measurements: Goal: Ability to maintain clinical measurements within normal limits will improve Outcome: Progressing Goal: Will remain free from infection Outcome: Progressing Goal: Diagnostic test results will improve Outcome: Progressing Goal: Respiratory complications will improve Outcome: Progressing Goal: Cardiovascular complication will be avoided Outcome: Progressing   Problem: Activity: Goal: Risk for activity intolerance will decrease Outcome: Progressing   Problem:  Nutrition: Goal: Adequate nutrition will be maintained Outcome: Progressing   Problem: Coping: Goal: Level of anxiety will decrease Outcome: Progressing   Problem: Elimination: Goal: Will not experience complications related to bowel motility Outcome: Progressing Goal: Will not experience complications related to urinary retention Outcome: Progressing   Problem: Pain Managment: Goal: General experience of comfort will improve and/or be controlled Outcome: Progressing   Problem: Safety: Goal: Ability to remain free from injury will improve Outcome: Progressing   Problem: Skin Integrity: Goal: Risk for impaired skin integrity will decrease Outcome: Progressing   Problem: Education: Goal: Knowledge of the prescribed therapeutic regimen will improve Outcome: Progressing Goal: Understanding of sexual limitations or changes related to disease process or condition will improve Outcome: Progressing Goal: Individualized Educational Video(s) Outcome: Progressing   Problem: Self-Concept: Goal: Communication of feelings regarding changes in body function or appearance will improve Outcome: Progressing   Problem: Skin Integrity: Goal: Demonstration of wound healing without infection will improve Outcome: Progressing   Problem: Education: Goal: Knowledge of condition will improve Outcome: Progressing Goal: Individualized Educational Video(s) Outcome: Progressing Goal: Individualized Newborn Educational Video(s) Outcome: Progressing   Problem: Activity: Goal: Will verbalize the importance of balancing activity with adequate rest periods Outcome: Progressing Goal: Ability to tolerate increased activity will improve Outcome: Progressing   Problem: Coping: Goal: Ability to identify and utilize available resources and services will improve Outcome: Progressing   Problem: Life Cycle: Goal: Chance of risk for complications during the postpartum period will decrease Outcome:  Progressing   Problem: Role Relationship: Goal: Ability to demonstrate positive interaction with newborn will improve Outcome: Progressing   Problem: Skin Integrity: Goal: Demonstration of wound healing without infection will improve Outcome: Progressing

## 2024-07-06 ENCOUNTER — Encounter (HOSPITAL_COMMUNITY): Payer: Self-pay | Admitting: Family Medicine

## 2024-07-06 ENCOUNTER — Other Ambulatory Visit (HOSPITAL_COMMUNITY): Payer: Self-pay

## 2024-07-06 DIAGNOSIS — D62 Acute posthemorrhagic anemia: Secondary | ICD-10-CM | POA: Diagnosis not present

## 2024-07-06 DIAGNOSIS — D72829 Elevated white blood cell count, unspecified: Secondary | ICD-10-CM | POA: Diagnosis not present

## 2024-07-06 LAB — CBC WITH DIFFERENTIAL/PLATELET
Abs Immature Granulocytes: 0.07 K/uL (ref 0.00–0.07)
Basophils Absolute: 0.1 K/uL (ref 0.0–0.1)
Basophils Relative: 0 %
Eosinophils Absolute: 0.4 K/uL (ref 0.0–0.5)
Eosinophils Relative: 4 %
HCT: 21.9 % — ABNORMAL LOW (ref 36.0–46.0)
Hemoglobin: 7.3 g/dL — ABNORMAL LOW (ref 12.0–15.0)
Immature Granulocytes: 1 %
Lymphocytes Relative: 24 %
Lymphs Abs: 2.7 K/uL (ref 0.7–4.0)
MCH: 25.5 pg — ABNORMAL LOW (ref 26.0–34.0)
MCHC: 33.3 g/dL (ref 30.0–36.0)
MCV: 76.6 fL — ABNORMAL LOW (ref 80.0–100.0)
Monocytes Absolute: 0.8 K/uL (ref 0.1–1.0)
Monocytes Relative: 7 %
Neutro Abs: 7.5 K/uL (ref 1.7–7.7)
Neutrophils Relative %: 64 %
Platelets: 173 K/uL (ref 150–400)
RBC: 2.86 MIL/uL — ABNORMAL LOW (ref 3.87–5.11)
RDW: 15.9 % — ABNORMAL HIGH (ref 11.5–15.5)
WBC: 11.6 K/uL — ABNORMAL HIGH (ref 4.0–10.5)
nRBC: 0 % (ref 0.0–0.2)

## 2024-07-06 MED ORDER — OXYCODONE HCL 5 MG PO TABS
5.0000 mg | ORAL_TABLET | ORAL | 0 refills | Status: DC | PRN
  Filled 2024-07-06: qty 30, 5d supply, fill #0

## 2024-07-06 MED ORDER — EPINEPHRINE 0.3 MG/0.3ML IJ SOAJ: 0.3000 mg | Freq: Once | INTRAMUSCULAR | Status: DC | PRN

## 2024-07-06 MED ORDER — ALBUTEROL SULFATE (2.5 MG/3ML) 0.083% IN NEBU: 2.5000 mg | INHALATION_SOLUTION | Freq: Once | RESPIRATORY_TRACT | Status: DC | PRN

## 2024-07-06 MED ORDER — SODIUM CHLORIDE 0.9 % IV SOLN: INTRAVENOUS | Status: DC | PRN

## 2024-07-06 MED ORDER — IRON SUCROSE 500 MG IVPB - SIMPLE MED
500.0000 mg | Freq: Once | INTRAVENOUS | Status: DC
  Filled 2024-07-06: qty 275

## 2024-07-06 MED ORDER — POLYETHYLENE GLYCOL 3350 17 GM/SCOOP PO POWD
17.0000 g | Freq: Every day | ORAL | 1 refills | Status: DC | PRN
  Filled 2024-07-06: qty 476, 28d supply, fill #0

## 2024-07-06 MED ORDER — SODIUM CHLORIDE 0.9 % IV SOLN
500.0000 mg | Freq: Once | INTRAVENOUS | Status: AC
  Administered 2024-07-06: 500 mg via INTRAVENOUS
  Filled 2024-07-06: qty 25

## 2024-07-06 MED ORDER — NORETHINDRONE 0.35 MG PO TABS: 1.0000 | ORAL_TABLET | Freq: Every day | ORAL | 3 refills | Status: DC

## 2024-07-06 MED ORDER — IBUPROFEN 600 MG PO TABS
600.0000 mg | ORAL_TABLET | Freq: Four times a day (QID) | ORAL | 1 refills | Status: DC | PRN
  Filled 2024-07-06: qty 30, 8d supply, fill #0

## 2024-07-06 MED ORDER — DIPHENHYDRAMINE HCL 50 MG/ML IJ SOLN: 25.0000 mg | Freq: Once | INTRAMUSCULAR | Status: DC | PRN

## 2024-07-06 MED ORDER — ACETAMINOPHEN 500 MG PO TABS
1000.0000 mg | ORAL_TABLET | Freq: Four times a day (QID) | ORAL | 1 refills | Status: DC | PRN
  Filled 2024-07-06: qty 60, 8d supply, fill #0

## 2024-07-06 MED ORDER — SODIUM CHLORIDE 0.9 % IV BOLUS: 500.0000 mL | Freq: Once | INTRAVENOUS | Status: DC | PRN

## 2024-07-06 MED ORDER — MEASLES, MUMPS & RUBELLA VAC IJ SOLR
0.5000 mL | Freq: Once | INTRAMUSCULAR | Status: AC
  Administered 2024-07-06: 0.5 mL via SUBCUTANEOUS
  Filled 2024-07-06: qty 0.5

## 2024-07-06 MED ORDER — METHYLPREDNISOLONE SODIUM SUCC 125 MG IJ SOLR: 125.0000 mg | Freq: Once | INTRAMUSCULAR | Status: DC | PRN

## 2024-07-06 NOTE — Patient Instructions (Signed)
 If interested in an outpatient lactation consult in office or virtually please reach out to us  at [facility] (First Floor) [address] [place] Please call [phone] and press [phone] for lactation.    Melodi Sprung, [facility] Center for [facility]

## 2024-07-06 NOTE — Lactation Note (Addendum)
 This note was copied from a baby's chart. Lactation Consultation Note  Patient Name: Jane Padilla Unijb'd Date: 07/06/2024 Age:28 hours Reason for consult: Follow-up assessment;1st time breastfeeding;Term  P1, Noted when baby cried, baby has short mid anterior lingual frenulum.  Mother states she has had difficulty latching on the L  breast.   Had mother hand express drops and baby latched with ease on the L breast. Mother denies pain with latch.  NP spoke to parents about frenulum. Mother did state she has a slight blister on the tip of her R nipple.   Recommend not holding breast tissue away from infant's nose. No cracks or abrasions. Coconut oil given. Recommend offering both breasts per feeding. Reviewed engorgement care and monitoring voids/stools. Mother has OP Lactation support at her Pediatrician's office in [place]. Suggest calling for help as needed.   Maternal Data Has patient been taught Hand Expression?: Yes Does the patient have breastfeeding experience prior to this delivery?: No  Feeding Mother's Current Feeding Choice: Breast Milk  LATCH Score Latch: Grasps breast easily, tongue down, lips flanged, rhythmical sucking.  Audible Swallowing: A few with stimulation  Type of Nipple: Everted at rest and after stimulation  Comfort (Breast/Nipple): Filling, red/small blisters or bruises, mild/mod discomfort  Hold (Positioning): Assistance needed to correctly position infant at breast and maintain latch.  LATCH Score: 7  Interventions Interventions: Breast feeding basics reviewed;Assisted with latch;Skin to skin;Position options;Education;Coconut oil  Discharge Discharge Education: Engorgement and breast care;Warning signs for feeding baby Pump: Personal;Hands Free  Consult Status Consult Status: Complete Date: 07/06/24   Shannon Levorn Lemme  RN, IBCLC 07/06/2024, 9:05 AM

## 2024-07-10 ENCOUNTER — Inpatient Hospital Stay (HOSPITAL_COMMUNITY): Admission: RE | Admit: 2024-07-10 | Payer: Self-pay | Source: Home / Self Care | Admitting: Family Medicine

## 2024-07-10 ENCOUNTER — Inpatient Hospital Stay (HOSPITAL_COMMUNITY): Payer: Self-pay

## 2024-07-14 ENCOUNTER — Telehealth (HOSPITAL_COMMUNITY): Payer: Self-pay | Admitting: *Deleted

## 2024-07-14 NOTE — Telephone Encounter (Signed)
 07/14/2024  Name: Jane Padilla MRN: [id number] DOB: 11-17-1996  Reason for Call:  Transition of Care Hospital Discharge Call  Contact Status: Patient Contact Status: Complete  Language assistant needed: Interpreter Mode: Interpreter Not Needed        Follow-Up Questions: Do You Have Any Concerns About Your Health As You Heal From Delivery?: Yes What Concerns Do You Have About Your Health?: Two days ago, patient noted a new onset of cramping sensation with urination.  Denies fever or increased urinary frequency.  Endorses urinary urgency.  Advised patient to call OB office and asked to be seen to determine if these symptoms are caused by UTI. Do You Have Any Concerns About Your Infants Health?: No  Edinburgh Postnatal Depression Scale:  In the Past 7 Days: I have been able to laugh and see the funny side of things.: As much as I always could I have looked forward with enjoyment to things.: As much as I ever did I have blamed myself unnecessarily when things went wrong.: Not very often I have been anxious or worried for no good reason.: No, not at all I have felt scared or panicky for no good reason.: No, not much Things have been getting on top of me.: No, I have been coping as well as ever I have been so unhappy that I have had difficulty sleeping.: Not at all I have felt sad or miserable.: No, not at all I have been so unhappy that I have been crying.: No, never The thought of harming myself has occurred to me.: Never Edinburgh Postnatal Depression Scale Total: 2  PHQ2-9 Depression Scale:     Discharge Follow-up: Edinburgh score requires follow up?: No Patient was advised of the following resources:: Support Group, Breastfeeding Support Group  Post-discharge interventions: Reviewed Newborn Safe Sleep Practices  Mliss Sieve, RN 07/14/2024 14:33

## 2024-07-16 NOTE — Progress Notes (Signed)
 Pt had expected blood loss anemia that was clinically significant for this admission.She was symptomatic and required  treatment with IV iron  infusion,

## 2024-08-02 ENCOUNTER — Ambulatory Visit (INDEPENDENT_AMBULATORY_CARE_PROVIDER_SITE_OTHER)

## 2024-08-02 VITALS — BP 108/66 | HR 74 | Ht 61.0 in | Wt 172.0 lb

## 2024-08-02 DIAGNOSIS — Z4889 Encounter for other specified surgical aftercare: Secondary | ICD-10-CM

## 2024-08-02 DIAGNOSIS — K458 Other specified abdominal hernia without obstruction or gangrene: Secondary | ICD-10-CM

## 2024-08-02 NOTE — Progress Notes (Signed)
 Incision Check Visit  Jane Padilla is here for incision check following primary c-section on 07/04/24. Patient is 4.1 weeks PP.  Assessment: Incision looks clean, dry, intact and well approximated. Small open scab-like area noted along R side of incision. No drainage or signs of infection noted. Patient reports she noticed small open scab-like area two days ago. Reviewed good daily wound care and s/s of infection with patient. Also, advised patient to keep dry cloth at incision site to keep from moisture.   Additionally, patient voiced concerns regarding small nodule located midline of abdomen. Patient states she noticed it during end of pregnancy and after delivery. Physicians to assess abdomen which identified findings of a hernia. Physicians advised patient how to care for hernia and advised patient to wear post pregnancy c-section wrap. Referral placed for physical therapy for pelvic floor therapy. All questions and concern answered.     Patient has a follow up post partum visit on 08/09/24 at 1:35 PM. Patient confirmed scheduled appointment.    Rosaline Pendleton, RN 08/02/2024  3:47 PM

## 2024-08-03 ENCOUNTER — Ambulatory Visit: Attending: Certified Nurse Midwife

## 2024-08-03 ENCOUNTER — Other Ambulatory Visit: Payer: Self-pay

## 2024-08-03 DIAGNOSIS — M6281 Muscle weakness (generalized): Secondary | ICD-10-CM | POA: Diagnosis present

## 2024-08-03 DIAGNOSIS — R279 Unspecified lack of coordination: Secondary | ICD-10-CM | POA: Insufficient documentation

## 2024-08-03 DIAGNOSIS — R102 Pelvic and perineal pain: Secondary | ICD-10-CM | POA: Diagnosis present

## 2024-08-03 DIAGNOSIS — K458 Other specified abdominal hernia without obstruction or gangrene: Secondary | ICD-10-CM | POA: Insufficient documentation

## 2024-08-03 DIAGNOSIS — R293 Abnormal posture: Secondary | ICD-10-CM | POA: Diagnosis present

## 2024-08-03 NOTE — Therapy (Signed)
 OUTPATIENT PHYSICAL THERAPY FEMALE PELVIC EVALUATION   Patient Name: Jane Padilla MRN: [id number] DOB:05/12/1996, 28 y.o., female Today's Date: 08/03/2024  END OF SESSION:  PT End of Session - 08/03/24 1443     Visit Number 1    Date for PT Re-Evaluation 01/18/25    Authorization Type [employer]    PT Start Time 1445    PT Stop Time 1525    PT Time Calculation (min) 40 min    Activity Tolerance Patient tolerated treatment well    Behavior During Therapy [facility] for tasks assessed/performed          Past Medical History:  Diagnosis Date   Anemia    Cesarean delivery delivered 07/06/2024   Vaginal Pap smear, abnormal    Past Surgical History:  Procedure Laterality Date   CESAREAN SECTION N/A 07/04/2024   Procedure: CESAREAN DELIVERY;  Surgeon: Nicholaus Burnard HERO, MD;  Location: [facility] LD ORS;  Service: Obstetrics;  Laterality: N/A;   OVARIAN CYST REMOVAL     Patient Active Problem List   Diagnosis Date Noted   Cesarean delivery delivered 07/06/2024   Anemia associated with acute blood loss 07/06/2024   Leukocytosis 07/06/2024   Arrest of dilation, delivered, current hospitalization 07/04/2024   Indication for care in labor or delivery 07/03/2024   Group B Streptococcus carrier, antepartum 06/11/2024   Pruritus of pregnancy 04/27/2024   Supervision of low-risk pregnancy 02/10/2024   Sickle cell trait (HCC) 02/10/2024   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   Irritable bowel syndrome with constipation 09/16/2021   Rosacea 09/16/2021   Migraines 08/02/2014   Ovarian torsion 08/02/2014    PCP: NA  REFERRING PROVIDER: Vannie Padilla SAUNDERS, CNM   REFERRING DIAG: K76.8 (ICD-10-CM) - Hernia of pelvic floor  THERAPY DIAG:  Muscle weakness (generalized)  Pelvic pain  Abnormal posture  Unspecified lack of coordination  Rationale for Evaluation and Treatment: Rehabilitation  ONSET DATE: 07/04/24  SUBJECTIVE:                                                                                                                                                                                            SUBJECTIVE STATEMENT: Pt had c-section 07/04/24. She is lactating. She states that she found out yesterday that she has midline abdominal hernia. She is also having a lot of pelvic pain - pubic symphysis and bil groin. She hears popping in her pelvis. She feels very unstable with walking   PAIN:  Are you having pain? Yes NPRS scale: 3/10 Pain location: pelvic   Pain type: aching Pain description: intermittent   Aggravating factors: walking, open legs  Relieving factors: rest  PRECAUTIONS: None  RED FLAGS: None   WEIGHT BEARING RESTRICTIONS: No  FALLS:  Has patient fallen in last 6 months? No  OCCUPATION: behavior analyst   ACTIVITY LEVEL : just started walking a mile a day this week  PLOF: Independent  PATIENT GOALS: decrease pain, rebuild pelvic floor, build up core strength  PERTINENT HISTORY:  Ovarian cyst removal; c-section 07/04/2024   BOWEL MOVEMENT: Pain with bowel movement: No Type of bowel movement:Type (Bristol Stool Scale) 3-4, Frequency 1-2x/week - always been like that, and Strain yes Fully empty rectum: Yes: sometimes Leakage: No Pads: No Fiber supplement/laxative No  URINATION: Pain with urination: Yes - sometimes will pinch when she urinates  Fully empty bladder: Yes: - Stream: Strong Urgency: No Frequency: WNL Fluid Intake: 64oz of water  a day Leakage: none Pads: No  INTERCOURSE:  Has not returned to intercourse, some pain with certain positions   PREGNANCY: Vaginal deliveries 0  C-section deliveries 1 Currently pregnant No  PROLAPSE: None   OBJECTIVE:  Note: Objective measures were completed at Evaluation unless otherwise noted.   PATIENT SURVEYS:   PFIQ-7: 39  COGNITION: Overall cognitive status: Within functional limits for tasks assessed     SENSATION: Light touch: Appears intact   FUNCTIONAL TESTS:  Squat:  Abdominal discomfort Single leg stance:  Rt: pelvic drop   Lt: stable Curl-up test: abdominal distortion  ACTIVE STRAIGHT LEG RAISE: (+) bil - could not lift either LE    GAIT: Assistive device utilized: None Comments: WNL  POSTURE: rounded shoulders, forward head, and anterior pelvic tilt   LUMBARAROM/PROM:  A/PROM A/PROM  Eval (% available)  Flexion 100  Extension 10 - pull at incision, nervous  Right lateral flexion WNL  Left lateral flexion WNL  Right rotation 75, pull  Left rotation 75, pull   (Blank rows = not tested)  PALPATION:  Pelvic Alignment: Lt posterior rotation   Abdominal: c-section scar tissue restriction and sensitivity; good breathing pattern and sternocostal angle; significant tenderness at pubic symphysis and throughout abdomen                External Perineal Exam: over clothing - felt for pelvic floor muscle contraction and she can actively contract and relax pelvic floor muscles                             Internal Pelvic Floor: deferred  Patient confirms identification and approves PT to assess internal pelvic floor and treatment Yes - in the future - we will wait for her to be at least 6 weeks postpartum to perform internal pelvic floor muscle assessment  PELVIC MMT: deferred   MMT eval  Vaginal   Internal Anal Sphincter   External Anal Sphincter   Puborectalis   Diastasis Recti   (Blank rows = not tested)        TONE: deferred  PROLAPSE: deferred  TODAY'S TREATMENT:  DATE:  08/03/24 EVAL  Neuromuscular re-education: Transversus abdominus training with multimodal cues for improved motor control and breath coordination Supine transversus abdominus with diaphragmatic breathing 10x Supine hip adduction + pelvic floor muscle + transversus abdominus 10x Supine hip abduction red band + pelvic floor muscle +  transversus abdominus 10x Exercises: Lower trunk rotation 10x  Cat cow 10x  Open books 5x bil    PATIENT EDUCATION:  Education details: See above Person educated: Patient Education method: Programmer, multimedia, Demonstration, Actor cues, Verbal cues, and Handouts Education comprehension: verbalized understanding  HOME EXERCISE PROGRAM: [id number]  ASSESSMENT:  CLINICAL IMPRESSION: Patient is a 28 y.o. female who was seen today for physical therapy evaluation and treatment for postpartum abdominal weakness. Exam findings notable for decrease lumbar extension with abdominal pulling at incision, pain with squat, abnormal posterior with increased anterior tilt, abdominal tenderness throughout with sensitivity and scar tissue restriction over c-section incision, diastasis recti abdominus 4-5 finger widths with area above umbilicus concerning for hernia, midline distortion with curl-up test, and (+) active straight leg raise bil. Signs and symptoms most consistent with normal postpartum weakness and healing with possible midline abdominal hernia and pelvic instability. Initial treatment included transversus abdominus training with diaphragmatic breathing and pelvic stability exercises; we also performed gentle stretches to work on improving mobility. She will continue to benefit from skilled PT intervention in order to decrease abdominal/pelvic pain, improve core strength, be able to walk without difficulty, improve quality of life, and progress functional strengthening program.   OBJECTIVE IMPAIRMENTS: decreased activity tolerance, decreased coordination, decreased endurance, decreased mobility, decreased ROM, decreased strength, increased fascial restrictions, increased muscle spasms, impaired flexibility, impaired tone, improper body mechanics, postural dysfunction, and pain.   ACTIVITY LIMITATIONS: lifting, bending, standing, squatting, and walking  PARTICIPATION LIMITATIONS: interpersonal  relationship, community activity, and exercise, caring for child  PERSONAL FACTORS: 1 comorbidity: medical history are also affecting patient's functional outcome.   REHAB POTENTIAL: Good  CLINICAL DECISION MAKING: Stable/uncomplicated  EVALUATION COMPLEXITY: Low   GOALS: Goals reviewed with patient? Yes  SHORT TERM GOALS: Target date: 08/31/2024   Pt will be independent with HEP in order to improve activity tolerance.   Baseline: Goal status: INITIAL  2.  Pt will be independent with use of squatty potty, relaxed toileting mechanics, and improved bowel movement techniques in order to increase ease of bowel movements and complete evacuation.    Baseline: incomplete emptying and 1-2 bowel movements a week with strianing Goal status: INITIAL  3.  Patient will report 25% improve in pelvic pain in order to increase activity tolerance.    Baseline: 3/10 Goal status: INITIAL  4.  Pt will be able to walk 1.5 miles without pelvic pain.  Baseline: walking 1 mile with pain Goal status: INITIAL    LONG TERM GOALS: Target date: 01/18/2025  Pt will be independent with advanced HEP in order to improve activity tolerance.   Baseline:  Goal status: INITIAL  2.  Patient will report 75% improve in pelvic pain in order to increase activity tolerance.   Baseline: 3/10 Goal status: INITIAL  3.  Patient will be able to walk for 3 miles without pelvic pain.  Baseline: 1 mile with pain Goal status: INITIAL  4.  Patient will be able to perform single leg stance with good pelvic stability in order to demonstrate appropriate pelvic floor muscle and transversus abdominus strength and coordination in order to decrease pelvic pain.   Baseline: pelvic drop Goal status: INITIAL  5.  Pt will be  able to perform active straight leg bil in order to demonstrate appropriate pelvic stability that will allow her to care for her son without difficulty.  Baseline: cannot lift either LE without  support Goal status: INITIAL    PLAN:  PT FREQUENCY: 1-2x/week  PT DURATION: 12 visits    PLANNED INTERVENTIONS: [id number]- PT Re-evaluation, 97110-Therapeutic exercises, 97530- Therapeutic activity, 97112- Neuromuscular re-education, 97535- Self Care, [id number]- Manual therapy, [phone]- Gait training, [phone]- Aquatic Therapy, [phone]- Electrical stimulation (unattended), [phone]- Traction (mechanical), [id number]- Ionotophoresis 4mg /ml Dexamethasone , 79439 (1-2 muscles), 20561 (3+ muscles)- Dry Needling, Patient/Family education, Balance training, Taping, Joint mobilization, Joint manipulation, Spinal manipulation, Spinal mobilization, Scar mobilization, Vestibular training, Cryotherapy, Moist heat, and Biofeedback  PLAN FOR NEXT SESSION: good bowel habit education; core strengthening     Josette Mares, PT, DPT08/07/254:05 PM

## 2024-08-09 ENCOUNTER — Other Ambulatory Visit: Payer: Self-pay

## 2024-08-09 ENCOUNTER — Ambulatory Visit: Admitting: Certified Nurse Midwife

## 2024-08-09 ENCOUNTER — Encounter: Payer: Self-pay | Admitting: Certified Nurse Midwife

## 2024-08-09 DIAGNOSIS — K429 Umbilical hernia without obstruction or gangrene: Secondary | ICD-10-CM | POA: Diagnosis not present

## 2024-08-09 DIAGNOSIS — Z98891 History of uterine scar from previous surgery: Secondary | ICD-10-CM

## 2024-08-09 DIAGNOSIS — M6208 Separation of muscle (nontraumatic), other site: Secondary | ICD-10-CM | POA: Diagnosis not present

## 2024-08-09 NOTE — Progress Notes (Signed)
 Postpartum Visit Note  Jane Padilla is a 28 y.o. G42P1001 female who presents for a postpartum visit. She is [redacted]w[redacted]d  postpartum following a primary cesarean section.  I have fully reviewed the prenatal and intrapartum course. The delivery was at [redacted]w[redacted]d.  Anesthesia: epidural. Postpartum course has been uncomplicated, she does have a large hernia that began during preganncy. Baby is doing well. Baby is feeding by breast. Bleeding no bleeding. Bowel function is normal. Bladder function is normal. Patient is not sexually active. Contraception method is none. Postpartum depression screening: negative.   Upstream - 08/09/24 1402       Pregnancy Intention Screening   Does the patient want to become pregnant in the next year? No    Does the patient's partner want to become pregnant in the next year? No    Would the patient like to discuss contraceptive options today? Yes      Contraception Wrap Up   Current Method Abstinence    End Method Oral Contraceptive         The pregnancy intention screening data noted above was reviewed. Potential methods of contraception were discussed. The patient elected to proceed with Oral Contraceptive.   Edinburgh Postnatal Depression Scale - 08/09/24 1410       Edinburgh Postnatal Depression Scale:  In the Past 7 Days   I have been able to laugh and see the funny side of things. 0    I have looked forward with enjoyment to things. 0    I have blamed myself unnecessarily when things went wrong. 0    I have been anxious or worried for no good reason. 0    I have felt scared or panicky for no good reason. 0    Things have been getting on top of me. 1    I have been so unhappy that I have had difficulty sleeping. 0    I have felt sad or miserable. 0    I have been so unhappy that I have been crying. 0    The thought of harming myself has occurred to me. 0    Edinburgh Postnatal Depression Scale Total 1         Health Maintenance Due  Topic Date  Due   Hepatitis C Screening  Never done   COVID-19 Vaccine (3 - 2024-25 season) 08/29/2023   INFLUENZA VACCINE  07/28/2024   The following portions of the patient's history were reviewed and updated as appropriate: allergies, current medications, past family history, past medical history, past social history, past surgical history, and problem list.  Review of Systems Pertinent items noted in HPI and remainder of comprehensive ROS otherwise negative.  Objective:  BP 106/72   Pulse 77   LMP 10/01/2023   Breastfeeding Yes    Constitutional: Alert, oriented female in no physical distress.  HEENT: PERRLA Skin: normal color and turgor, no rash Cardiovascular: normal rate & rhythm Respiratory: normal effort, no problems with respiration noted GI: Abd soft, non-tender, hernia recedes easily MS: Extremities nontender, no edema, normal ROM Neurologic: Alert and oriented x 4.  GU: no CVA tenderness Pelvic: exam deferred Breasts: normal lactating breasts, no edema or signs of nipple damage Incision: well-approximated without exudate, redness or edema.       Assessment:  1. Postpartum care and examination (Primary) - Recovering well overall  2. History of cesarean section - Arrest of dilation (was 9cm, -2 station for 10hrs)  3. Umbilical hernia without obstruction and without gangrene -  Advised to wear postpartum abdominal support - If no improvement after PFPT, will send gen surg referral  4. Diastasis recti - Referred to PFPT at her incision check   Plan:   Essential components of care per ACOG recommendations:  1.  Mood and well being: Patient with negative depression screening today. Reviewed local resources for support.  - Patient tobacco use? No.   - hx of drug use? No.    2. Infant care and feeding:  -Patient currently breastmilk feeding? Yes. Reviewed importance of draining breast regularly to support lactation.  -Social determinants of health (SDOH) reviewed in EPIC.  No concerns  3. Sexuality, contraception and birth spacing - Patient does not want a pregnancy in the next year.  Desired family size is 1 children.  - Reviewed reproductive life planning. Reviewed contraceptive methods based on pt preferences and effectiveness.  Patient desired Abstinence, Female Condom, and Oral Contraceptive today.   - Discussed birth spacing of 18 months  4. Sleep and fatigue -Encouraged family/partner/community support of 4 hrs of uninterrupted sleep to help with mood and fatigue  5. Physical Recovery  - Discussed patients delivery and complications. She describes her labor as mixed. - Patient had a C-section failure to progress. Healing well. - Patient has urinary incontinence? No. - Patient is safe to resume physical and sexual activity  6.  Health Maintenance - HM due items addressed Yes - Last pap smear 04/12/23 NILM Pap smear not done at today's visit.  -Breast Cancer screening indicated? No.   7. Chronic Disease/Pregnancy Condition follow up: Hernia and diastasis recti - PCP follow up as needed  Cornell JONELLE Finder, CNM [facility] [employer], [facility]

## 2024-08-10 ENCOUNTER — Encounter: Payer: Self-pay | Admitting: Certified Nurse Midwife

## 2024-08-10 DIAGNOSIS — D62 Acute posthemorrhagic anemia: Secondary | ICD-10-CM

## 2024-08-16 ENCOUNTER — Ambulatory Visit: Payer: Self-pay | Admitting: Physical Therapy

## 2024-08-16 DIAGNOSIS — M6281 Muscle weakness (generalized): Secondary | ICD-10-CM | POA: Diagnosis not present

## 2024-08-16 DIAGNOSIS — R102 Pelvic and perineal pain: Secondary | ICD-10-CM

## 2024-08-16 DIAGNOSIS — R279 Unspecified lack of coordination: Secondary | ICD-10-CM

## 2024-08-16 DIAGNOSIS — R293 Abnormal posture: Secondary | ICD-10-CM

## 2024-08-16 NOTE — Therapy (Signed)
 OUTPATIENT PHYSICAL THERAPY FEMALE PELVIC TREATMENT   Patient Name: Jane Padilla MRN: [id number] DOB:06-14-1996, 28 y.o., female Today's Date: 08/16/2024  END OF SESSION:  PT End of Session - 08/16/24 1236     Visit Number 2    Date for PT Re-Evaluation 01/18/25    Authorization Type [employer]    PT Start Time 1231    PT Stop Time 1310    PT Time Calculation (min) 39 min    Activity Tolerance Patient tolerated treatment well    Behavior During Therapy [facility] for tasks assessed/performed          Past Medical History:  Diagnosis Date   Anemia    Cesarean delivery delivered 07/06/2024   Vaginal Pap smear, abnormal    Past Surgical History:  Procedure Laterality Date   CESAREAN SECTION N/A 07/04/2024   Procedure: CESAREAN DELIVERY;  Surgeon: Nicholaus Burnard HERO, MD;  Location: [facility] LD ORS;  Service: Obstetrics;  Laterality: N/A;   OVARIAN CYST REMOVAL     Patient Active Problem List   Diagnosis Date Noted   Cesarean delivery delivered 07/06/2024   Group B Streptococcus carrier, antepartum 06/11/2024   Sickle cell trait (HCC) 02/10/2024   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   Irritable bowel syndrome with constipation 09/16/2021   Rosacea 09/16/2021   Migraines 08/02/2014    PCP: NA  REFERRING PROVIDER: Vannie Cornell SAUNDERS, CNM   REFERRING DIAG: K42.8 (ICD-10-CM) - Hernia of pelvic floor  THERAPY DIAG:  Muscle weakness (generalized)  Pelvic pain  Abnormal posture  Unspecified lack of coordination  Rationale for Evaluation and Treatment: Rehabilitation  ONSET DATE: 07/04/24  SUBJECTIVE:                                                                                                                                                                                           SUBJECTIVE STATEMENT: Pt reports continues to feel unstable, weak at hips and core. Still breastfeeding. Walking and floor to stand transfers, stairs are difficult. Pelvic pain and fatigue continues.     PAIN:   Are you having pain? Yes NPRS scale: 3/10 Pain location: pelvic   Pain type: aching Pain description: intermittent   Aggravating factors: walking, open legs  Relieving factors: rest  PRECAUTIONS: None  RED FLAGS: None   WEIGHT BEARING RESTRICTIONS: No  FALLS:  Has patient fallen in last 6 months? No  OCCUPATION: behavior analyst   ACTIVITY LEVEL : just started walking a mile a day this week  PLOF: Independent  PATIENT GOALS: decrease pain, rebuild pelvic floor, build up core strength  PERTINENT HISTORY:  Ovarian cyst removal; c-section 07/04/2024  BOWEL MOVEMENT: Pain with bowel movement: No Type of bowel movement:Type (Bristol Stool Scale) 3-4, Frequency 1-2x/week - always been like that, and Strain yes Fully empty rectum: Yes: sometimes Leakage: No Pads: No Fiber supplement/laxative No  URINATION: Pain with urination: Yes - sometimes will pinch when she urinates  Fully empty bladder: Yes: - Stream: Strong Urgency: No Frequency: WNL Fluid Intake: 64oz of water  a day Leakage: none Pads: No  INTERCOURSE:  Has not returned to intercourse, some pain with certain positions   PREGNANCY: Vaginal deliveries 0  C-section deliveries 1 Currently pregnant No  PROLAPSE: None   OBJECTIVE:  Note: Objective measures were completed at Evaluation unless otherwise noted.   PATIENT SURVEYS:   PFIQ-7: 34  COGNITION: Overall cognitive status: Within functional limits for tasks assessed     SENSATION: Light touch: Appears intact   FUNCTIONAL TESTS:  Squat: Abdominal discomfort Single leg stance:  Rt: pelvic drop   Lt: stable Curl-up test: abdominal distortion  ACTIVE STRAIGHT LEG RAISE: (+) bil - could not lift either LE    GAIT: Assistive device utilized: None Comments: WNL  POSTURE: rounded shoulders, forward head, and anterior pelvic tilt   LUMBARAROM/PROM:  A/PROM A/PROM  Eval (% available)  Flexion 100  Extension 10 - pull at  incision, nervous  Right lateral flexion WNL  Left lateral flexion WNL  Right rotation 75, pull  Left rotation 75, pull   (Blank rows = not tested)  PALPATION:  Pelvic Alignment: Lt posterior rotation   Abdominal: c-section scar tissue restriction and sensitivity; good breathing pattern and sternocostal angle; significant tenderness at pubic symphysis and throughout abdomen                External Perineal Exam: over clothing - felt for pelvic floor muscle contraction and she can actively contract and relax pelvic floor muscles                             Internal Pelvic Floor: deferred  Patient confirms identification and approves PT to assess internal pelvic floor and treatment Yes - in the future - we will wait for her to be at least 6 weeks postpartum to perform internal pelvic floor muscle assessment  PELVIC MMT: deferred   MMT eval  Vaginal   Internal Anal Sphincter   External Anal Sphincter   Puborectalis   Diastasis Recti   (Blank rows = not tested)        TONE: deferred  PROLAPSE: deferred  TODAY'S TREATMENT:                                                                                                                              DATE:  08/03/24 EVAL  Neuromuscular re-education: Transversus abdominus training with multimodal cues for improved motor control and breath coordination Supine transversus abdominus with diaphragmatic breathing 10x Supine hip adduction + pelvic floor muscle + transversus abdominus 10x  Supine hip abduction red band + pelvic floor muscle + transversus abdominus 10x Exercises: Lower trunk rotation 10x  Cat cow 10x  Open books 5x bil   08/16/24: NMRE: all exercises for exhale and pelvic floor contraction with activity for improved coordination of muscle activation and decreased stress at pelvic floor for decreased leakage.  Hooklying transverse abdominis activation activation x20 - max cues to improve techniques then coordination with  breathing Hooklying red loop hip abduction 2x10 Hooklying red loop hip flexion alt 2x10  Hooklying red band bil shoulder extension 2x10  Sidelying ball press with hip abduction 2x10 Seated opposite hand/knee ball press 2x10  Educated on log rolling and pre-contraction of transverse abdominis for rolling and transfers to decreased pain  PATIENT EDUCATION:  Education details: See above Person educated: Patient Education method: Programmer, multimedia, Facilities manager, Actor cues, Verbal cues, and Handouts Education comprehension: verbalized understanding  HOME EXERCISE PROGRAM: [id number]  ASSESSMENT:  CLINICAL IMPRESSION: Patient is a 28 y.o. female who was seen today for physical therapy treatment for postpartum abdominal weakness. Pt tolerated session well but does need max verbal cues and tactile cues for core activation and coordination with breathing. Pt continues to demonstrate core and hip weakness and instability. She will continue to benefit from skilled PT intervention in order to decrease abdominal/pelvic pain, improve core strength, be able to walk without difficulty, improve quality of life, and progress functional strengthening program.   OBJECTIVE IMPAIRMENTS: decreased activity tolerance, decreased coordination, decreased endurance, decreased mobility, decreased ROM, decreased strength, increased fascial restrictions, increased muscle spasms, impaired flexibility, impaired tone, improper body mechanics, postural dysfunction, and pain.   ACTIVITY LIMITATIONS: lifting, bending, standing, squatting, and walking  PARTICIPATION LIMITATIONS: interpersonal relationship, community activity, and exercise, caring for child  PERSONAL FACTORS: 1 comorbidity: medical history are also affecting patient's functional outcome.   REHAB POTENTIAL: Good  CLINICAL DECISION MAKING: Stable/uncomplicated  EVALUATION COMPLEXITY: Low   GOALS: Goals reviewed with patient? Yes  SHORT TERM GOALS:  Target date: 08/31/2024   Pt will be independent with HEP in order to improve activity tolerance.   Baseline: Goal status: INITIAL  2.  Pt will be independent with use of squatty potty, relaxed toileting mechanics, and improved bowel movement techniques in order to increase ease of bowel movements and complete evacuation.    Baseline: incomplete emptying and 1-2 bowel movements a week with strianing Goal status: INITIAL  3.  Patient will report 25% improve in pelvic pain in order to increase activity tolerance.    Baseline: 3/10 Goal status: INITIAL  4.  Pt will be able to walk 1.5 miles without pelvic pain.  Baseline: walking 1 mile with pain Goal status: INITIAL    LONG TERM GOALS: Target date: 01/18/2025  Pt will be independent with advanced HEP in order to improve activity tolerance.   Baseline:  Goal status: INITIAL  2.  Patient will report 75% improve in pelvic pain in order to increase activity tolerance.   Baseline: 3/10 Goal status: INITIAL  3.  Patient will be able to walk for 3 miles without pelvic pain.  Baseline: 1 mile with pain Goal status: INITIAL  4.  Patient will be able to perform single leg stance with good pelvic stability in order to demonstrate appropriate pelvic floor muscle and transversus abdominus strength and coordination in order to decrease pelvic pain.   Baseline: pelvic drop Goal status: INITIAL  5.  Pt will be able to perform active straight leg bil in order to demonstrate appropriate pelvic stability  that will allow her to care for her son without difficulty.  Baseline: cannot lift either LE without support Goal status: INITIAL    PLAN:  PT FREQUENCY: 1-2x/week  PT DURATION: 12 visits    PLANNED INTERVENTIONS: 97164- PT Re-evaluation, 97110-Therapeutic exercises, 97530- Therapeutic activity, 97112- Neuromuscular re-education, 97535- Self Care, 02859- Manual therapy, [phone]- Gait training, [phone]- Aquatic Therapy, [phone]-  Electrical stimulation (unattended), [phone]- Traction (mechanical), [id number]- Ionotophoresis 4mg /ml Dexamethasone , 79439 (1-2 muscles), 20561 (3+ muscles)- Dry Needling, Patient/Family education, Balance training, Taping, Joint mobilization, Joint manipulation, Spinal manipulation, Spinal mobilization, Scar mobilization, Vestibular training, Cryotherapy, Moist heat, and Biofeedback  PLAN FOR NEXT SESSION: good bowel habit education; core strengthening     Darryle Navy, PT, DPT 08/20/251:55 PM  [facility] [address] [place], [place] [address] Phone # [phone] Fax [phone]

## 2024-08-24 ENCOUNTER — Other Ambulatory Visit: Payer: Self-pay

## 2024-08-24 DIAGNOSIS — D62 Acute posthemorrhagic anemia: Secondary | ICD-10-CM

## 2024-08-24 DIAGNOSIS — R7989 Other specified abnormal findings of blood chemistry: Secondary | ICD-10-CM

## 2024-08-24 LAB — CBC
Hematocrit: 39.2 % (ref 34.0–46.6)
Hemoglobin: 12.2 g/dL (ref 11.1–15.9)
MCH: 26.5 pg — ABNORMAL LOW (ref 26.6–33.0)
MCHC: 31.1 g/dL — ABNORMAL LOW (ref 31.5–35.7)
MCV: 85 fL (ref 79–97)
Platelets: 286 x10E3/uL (ref 150–450)
RBC: 4.6 x10E6/uL (ref 3.77–5.28)
RDW: 18.5 % — ABNORMAL HIGH (ref 11.7–15.4)
WBC: 4.6 x10E3/uL (ref 3.4–10.8)

## 2024-08-25 LAB — COMPREHENSIVE METABOLIC PANEL WITH GFR
ALT: 22 IU/L (ref 0–32)
AST: 25 IU/L (ref 0–40)
Albumin: 4.2 g/dL (ref 4.0–5.0)
Alkaline Phosphatase: 150 IU/L — ABNORMAL HIGH (ref 44–121)
BUN/Creatinine Ratio: 10 (ref 9–23)
BUN: 10 mg/dL (ref 6–20)
Bilirubin Total: 0.4 mg/dL (ref 0.0–1.2)
CO2: 22 mmol/L (ref 20–29)
Calcium: 10.2 mg/dL (ref 8.7–10.2)
Chloride: 104 mmol/L (ref 96–106)
Creatinine, Ser: 0.98 mg/dL (ref 0.57–1.00)
Globulin, Total: 2.9 g/dL (ref 1.5–4.5)
Glucose: 66 mg/dL — ABNORMAL LOW (ref 70–99)
Potassium: 4.5 mmol/L (ref 3.5–5.2)
Sodium: 141 mmol/L (ref 134–144)
Total Protein: 7.1 g/dL (ref 6.0–8.5)
eGFR: 81 mL/min/1.73 (ref >59)

## 2024-08-27 ENCOUNTER — Ambulatory Visit: Payer: Self-pay | Admitting: Certified Nurse Midwife

## 2024-08-27 ENCOUNTER — Ambulatory Visit: Payer: Self-pay | Admitting: Advanced Practice Midwife

## 2024-08-29 ENCOUNTER — Ambulatory Visit: Payer: Self-pay | Attending: Certified Nurse Midwife | Admitting: Physical Therapy

## 2024-08-29 DIAGNOSIS — M6281 Muscle weakness (generalized): Secondary | ICD-10-CM | POA: Diagnosis present

## 2024-08-29 DIAGNOSIS — R293 Abnormal posture: Secondary | ICD-10-CM | POA: Diagnosis present

## 2024-08-29 DIAGNOSIS — R102 Pelvic and perineal pain: Secondary | ICD-10-CM | POA: Insufficient documentation

## 2024-08-29 DIAGNOSIS — R279 Unspecified lack of coordination: Secondary | ICD-10-CM | POA: Insufficient documentation

## 2024-08-29 NOTE — Therapy (Signed)
 OUTPATIENT PHYSICAL THERAPY FEMALE PELVIC TREATMENT   Patient Name: Jane Padilla MRN: [id number] DOB:[birth year], 28 y.o., female Today's Date: 08/29/2024  END OF SESSION:  PT End of Session - 08/29/24 0931     Visit Number 3    Date for PT Re-Evaluation 01/18/25    Authorization Type [employer]et[employer]    PT Start Time [phone]    PT Stop Time 1014    PT Time Calculation (min) 43 min    Activity Tolerance Patient tolerated treatment well    Behavior During Therapy [facility] for tasks assessed/performed          Past Medical History:  Diagnosis Date   Anemia    Cesarean delivery delivered 07/06/2024   Vaginal Pap smear, abnormal    Past Surgical History:  Procedure Laterality Date   CESAREAN SECTION N/A 07/04/2024   Procedure: CESAREAN DELIVERY;  Surgeon: Nicholaus Burnard HERO, MD;  Location: [facility] LD ORS;  Service: Obstetrics;  Laterality: N/A;   OVARIAN CYST REMOVAL     Patient Active Problem List   Diagnosis Date Noted   Cesarean delivery delivered 07/06/2024   Group B Streptococcus carrier, antepartum 06/11/2024   Sickle cell trait (HCC) 02/10/2024   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   Irritable bowel syndrome with constipation 09/16/2021   Rosacea 09/16/2021   Migraines 08/02/2014    PCP: NA  REFERRING PROVIDER: Vannie Cornell SAUNDERS, CNM   REFERRING DIAG: K37.8 (ICD-10-CM) - Hernia of pelvic floor  THERAPY DIAG:  Muscle weakness (generalized)  Pelvic pain  Abnormal posture  Unspecified lack of coordination  Rationale for Evaluation and Treatment: Rehabilitation  ONSET DATE: 07/04/24  SUBJECTIVE:                                                                                                                                                                                           SUBJECTIVE STATEMENT: Pt reports continues to feel unstable, weak at hips and core. Still breastfeeding. Walking and floor to stand transfers, stairs are difficult. Pelvic pain and fatigue continues.     PAIN:   Are you having pain? Yes NPRS scale: 3/10 Pain location: pelvic   Pain type: aching Pain description: intermittent   Aggravating factors: walking, open legs  Relieving factors: rest  PRECAUTIONS: None  RED FLAGS: None   WEIGHT BEARING RESTRICTIONS: No  FALLS:  Has patient fallen in last 6 months? No  OCCUPATION: behavior analyst   ACTIVITY LEVEL : just started walking a mile a day this week  PLOF: Independent  PATIENT GOALS: decrease pain, rebuild pelvic floor, build up core strength  PERTINENT HISTORY:  Ovarian cyst removal; c-section 07/04/2024  BOWEL MOVEMENT: Pain with bowel movement: No Type of bowel movement:Type (Bristol Stool Scale) 3-4, Frequency 1-2x/week - always been like that, and Strain yes Fully empty rectum: Yes: sometimes Leakage: No Pads: No Fiber supplement/laxative No  URINATION: Pain with urination: Yes - sometimes will pinch when she urinates  Fully empty bladder: Yes: - Stream: Strong Urgency: No Frequency: WNL Fluid Intake: 64oz of water  a day Leakage: none Pads: No  INTERCOURSE:  Has not returned to intercourse, some pain with certain positions   PREGNANCY: Vaginal deliveries 0  C-section deliveries 1 Currently pregnant No  PROLAPSE: None   OBJECTIVE:  Note: Objective measures were completed at Evaluation unless otherwise noted.   PATIENT SURVEYS:   PFIQ-7: 37  COGNITION: Overall cognitive status: Within functional limits for tasks assessed     SENSATION: Light touch: Appears intact   FUNCTIONAL TESTS:  Squat: Abdominal discomfort Single leg stance:  Rt: pelvic drop   Lt: stable Curl-up test: abdominal distortion  ACTIVE STRAIGHT LEG RAISE: (+) bil - could not lift either LE    GAIT: Assistive device utilized: None Comments: WNL  POSTURE: rounded shoulders, forward head, and anterior pelvic tilt   LUMBARAROM/PROM:  A/PROM A/PROM  Eval (% available)  Flexion 100  Extension 10 - pull at  incision, nervous  Right lateral flexion WNL  Left lateral flexion WNL  Right rotation 75, pull  Left rotation 75, pull   (Blank rows = not tested)  PALPATION:  Pelvic Alignment: Lt posterior rotation   Abdominal: c-section scar tissue restriction and sensitivity; good breathing pattern and sternocostal angle; significant tenderness at pubic symphysis and throughout abdomen                External Perineal Exam: over clothing - felt for pelvic floor muscle contraction and she can actively contract and relax pelvic floor muscles                             Internal Pelvic Floor: deferred  Patient confirms identification and approves PT to assess internal pelvic floor and treatment Yes - in the future - we will wait for her to be at least 6 weeks postpartum to perform internal pelvic floor muscle assessment  PELVIC MMT: deferred   MMT eval  Vaginal   Internal Anal Sphincter   External Anal Sphincter   Puborectalis   Diastasis Recti   (Blank rows = not tested)        TONE: deferred  PROLAPSE: deferred  TODAY'S TREATMENT:                                                                                                                              DATE:  08/03/24 EVAL  Neuromuscular re-education: Transversus abdominus training with multimodal cues for improved motor control and breath coordination Supine transversus abdominus with diaphragmatic breathing 10x Supine hip adduction + pelvic floor muscle + transversus abdominus 10x  Supine hip abduction red band + pelvic floor muscle + transversus abdominus 10x Exercises: Lower trunk rotation 10x  Cat cow 10x  Open books 5x bil   08/16/24: NMRE: all exercises for exhale and pelvic floor contraction with activity for improved coordination of muscle activation and decreased stress at pelvic floor for decreased leakage.  Hooklying transverse abdominis activation activation x20 - max cues to improve techniques then coordination with  breathing Hooklying red loop hip abduction 2x10 Hooklying red loop hip flexion alt 2x10  Hooklying red band bil shoulder extension 2x10  Sidelying ball press with hip abduction 2x10 Seated opposite hand/knee ball press 2x10  Educated on log rolling and pre-contraction of transverse abdominis for rolling and transfers to decreased pain  08/29/24: Assessed DRA separation - in standing notable bulge above umbilicus. In supine with active sit up test - 3 finger width separation where bulge was noted, does taper to 2 to 1 finger width below umbilicus. Poor muscle density  Supine x20 transverse abdominis activation - moderate cues for techniques  Opposite hand/knee ball press 2x10 improved techniques with towel roll at low back for feedback for transverse abdominis activation activation Bridges with hip adduction 2x10 + transverse abdominis activation and exhale Sidelying hip abduction with ball press 2x10  Cat/cow x10 Thread the needle x10 each K-tape x2 X on lina alba above and at umbilicus for improved tissue approximation postpartum with DRA separation.  Hooklying transverse abdominis activation with alt marching 2x10 Seated green band anti rotations 2x10 with transverse abdominis activation activations  each hand  PATIENT EDUCATION:  Education details: See above Person educated: Patient Education method: Explanation, Demonstration, Tactile cues, Verbal cues, and Handouts Education comprehension: verbalized understanding  HOME EXERCISE PROGRAM: [id number]  ASSESSMENT:  CLINICAL IMPRESSION: Patient is a 28 y.o. female who was seen today for physical therapy treatment for postpartum abdominal weakness. Pt tolerated session well but does need max verbal cues and tactile cues for core activation and coordination with breathing. Pt continues to demonstrate core and hip weakness and instability. Benefited from these cues for decreased abdominal distortion, towel roll helped with feedback in  hooklying and per pt able to feel contraction much better post taping today. Pt educated on how/when to remove tape and to remove if any irritation, redness, or itching occurs. She will continue to benefit from skilled PT intervention in order to decrease abdominal/pelvic pain, improve core strength, be able to walk without difficulty, improve quality of life, and progress functional strengthening program.   OBJECTIVE IMPAIRMENTS: decreased activity tolerance, decreased coordination, decreased endurance, decreased mobility, decreased ROM, decreased strength, increased fascial restrictions, increased muscle spasms, impaired flexibility, impaired tone, improper body mechanics, postural dysfunction, and pain.   ACTIVITY LIMITATIONS: lifting, bending, standing, squatting, and walking  PARTICIPATION LIMITATIONS: interpersonal relationship, community activity, and exercise, caring for child  PERSONAL FACTORS: 1 comorbidity: medical history are also affecting patient's functional outcome.   REHAB POTENTIAL: Good  CLINICAL DECISION MAKING: Stable/uncomplicated  EVALUATION COMPLEXITY: Low   GOALS: Goals reviewed with patient? Yes  SHORT TERM GOALS: Target date: 08/31/2024   Pt will be independent with HEP in order to improve activity tolerance.   Baseline: Goal status: INITIAL  2.  Pt will be independent with use of squatty potty, relaxed toileting mechanics, and improved bowel movement techniques in order to increase ease of bowel movements and complete evacuation.    Baseline: incomplete emptying and 1-2 bowel movements a week with strianing Goal status: INITIAL  3.  Patient will report 25%  improve in pelvic pain in order to increase activity tolerance.    Baseline: 3/10 Goal status: INITIAL  4.  Pt will be able to walk 1.5 miles without pelvic pain.  Baseline: walking 1 mile with pain Goal status: INITIAL    LONG TERM GOALS: Target date: 01/18/2025  Pt will be independent with  advanced HEP in order to improve activity tolerance.   Baseline:  Goal status: INITIAL  2.  Patient will report 75% improve in pelvic pain in order to increase activity tolerance.   Baseline: 3/10 Goal status: INITIAL  3.  Patient will be able to walk for 3 miles without pelvic pain.  Baseline: 1 mile with pain Goal status: INITIAL  4.  Patient will be able to perform single leg stance with good pelvic stability in order to demonstrate appropriate pelvic floor muscle and transversus abdominus strength and coordination in order to decrease pelvic pain.   Baseline: pelvic drop Goal status: INITIAL  5.  Pt will be able to perform active straight leg bil in order to demonstrate appropriate pelvic stability that will allow her to care for her son without difficulty.  Baseline: cannot lift either LE without support Goal status: INITIAL    PLAN:  PT FREQUENCY: 1-2x/week  PT DURATION: 12 visits    PLANNED INTERVENTIONS: 97164- PT Re-evaluation, 97110-Therapeutic exercises, 97530- Therapeutic activity, 97112- Neuromuscular re-education, 97535- Self Care, 02859- Manual therapy, [phone]- Gait training, [phone]- Aquatic Therapy, [phone]- Electrical stimulation (unattended), [phone]- Traction (mechanical), [id number]- Ionotophoresis 4mg /ml Dexamethasone , 79439 (1-2 muscles), 20561 (3+ muscles)- Dry Needling, Patient/Family education, Balance training, Taping, Joint mobilization, Joint manipulation, Spinal manipulation, Spinal mobilization, Scar mobilization, Vestibular training, Cryotherapy, Moist heat, and Biofeedback  PLAN FOR NEXT SESSION: good bowel habit education; core strengthening     Darryle Navy, PT, DPT 08/29/2509:39 AM  [facility] [address] [place], [place] [address] Phone # [phone] Fax [phone]

## 2024-09-11 ENCOUNTER — Ambulatory Visit: Payer: Self-pay | Admitting: Physical Therapy

## 2024-09-11 DIAGNOSIS — M6281 Muscle weakness (generalized): Secondary | ICD-10-CM | POA: Diagnosis not present

## 2024-09-11 DIAGNOSIS — R102 Pelvic and perineal pain: Secondary | ICD-10-CM

## 2024-09-11 DIAGNOSIS — R293 Abnormal posture: Secondary | ICD-10-CM

## 2024-09-11 DIAGNOSIS — R279 Unspecified lack of coordination: Secondary | ICD-10-CM

## 2024-09-11 NOTE — Therapy (Signed)
 OUTPATIENT PHYSICAL THERAPY FEMALE PELVIC TREATMENT   Patient Name: Jane Padilla MRN: [id number] DOB:29-Nov-1996, 28 y.o., female Today's Date: 09/11/2024  END OF SESSION:  PT End of Session - 09/11/24 0931     Visit Number 4    Date for PT Re-Evaluation 01/18/25    Authorization Type [employer]    PT Start Time 0930    PT Stop Time 1009    PT Time Calculation (min) 39 min    Activity Tolerance Patient tolerated treatment well    Behavior During Therapy [facility] for tasks assessed/performed          Past Medical History:  Diagnosis Date   Anemia    Cesarean delivery delivered 07/06/2024   Vaginal Pap smear, abnormal    Past Surgical History:  Procedure Laterality Date   CESAREAN SECTION N/A 07/04/2024   Procedure: CESAREAN DELIVERY;  Surgeon: Nicholaus Burnard HERO, MD;  Location: [facility] LD ORS;  Service: Obstetrics;  Laterality: N/A;   OVARIAN CYST REMOVAL     Patient Active Problem List   Diagnosis Date Noted   Cesarean delivery delivered 07/06/2024   Group B Streptococcus carrier, antepartum 06/11/2024   Sickle cell trait (HCC) 02/10/2024   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   Irritable bowel syndrome with constipation 09/16/2021   Rosacea 09/16/2021   Migraines 08/02/2014    PCP: NA  REFERRING PROVIDER: Vannie Cornell SAUNDERS, CNM   REFERRING DIAG: K42.8 (ICD-10-CM) - Hernia of pelvic floor  THERAPY DIAG:  Muscle weakness (generalized)  Pelvic pain  Abnormal posture  Unspecified lack of coordination  Rationale for Evaluation and Treatment: Rehabilitation  ONSET DATE: 07/04/24  SUBJECTIVE:                                                                                                                                                                                           SUBJECTIVE STATEMENT: Bowels are moving better, feels sore after more active day (several errands, loading and unloading the car seat and baby several times a day).    PAIN:  Are you having pain? No NPRS  scale: 0/10 Pain location: pelvic   Pain type: aching Pain description: intermittent   Aggravating factors: walking, open legs  Relieving factors: rest  PRECAUTIONS: None  RED FLAGS: None   WEIGHT BEARING RESTRICTIONS: No  FALLS:  Has patient fallen in last 6 months? No  OCCUPATION: behavior analyst   ACTIVITY LEVEL : just started walking a mile a day this week  PLOF: Independent  PATIENT GOALS: decrease pain, rebuild pelvic floor, build up core strength  PERTINENT HISTORY:  Ovarian cyst removal; c-section 07/04/2024   BOWEL MOVEMENT: Pain with  bowel movement: No Type of bowel movement:Type (Bristol Stool Scale) 3-4, Frequency 1-2x/week - always been like that, and Strain yes Fully empty rectum: Yes: sometimes Leakage: No Pads: No Fiber supplement/laxative No  URINATION: Pain with urination: Yes - sometimes will pinch when she urinates  Fully empty bladder: Yes: - Stream: Strong Urgency: No Frequency: WNL Fluid Intake: 64oz of water  a day Leakage: none Pads: No  INTERCOURSE:  Has not returned to intercourse, some pain with certain positions   PREGNANCY: Vaginal deliveries 0  C-section deliveries 1 Currently pregnant No  PROLAPSE: None   OBJECTIVE:  Note: Objective measures were completed at Evaluation unless otherwise noted.   PATIENT SURVEYS:   PFIQ-7: 64  COGNITION: Overall cognitive status: Within functional limits for tasks assessed     SENSATION: Light touch: Appears intact   FUNCTIONAL TESTS:  Squat: Abdominal discomfort Single leg stance:  Rt: pelvic drop   Lt: stable Curl-up test: abdominal distortion  ACTIVE STRAIGHT LEG RAISE: (+) bil - could not lift either LE    GAIT: Assistive device utilized: None Comments: WNL  POSTURE: rounded shoulders, forward head, and anterior pelvic tilt   LUMBARAROM/PROM:  A/PROM A/PROM  Eval (% available)  Flexion 100  Extension 10 - pull at incision, nervous  Right lateral flexion  WNL  Left lateral flexion WNL  Right rotation 75, pull  Left rotation 75, pull   (Blank rows = not tested)  PALPATION:  Pelvic Alignment: Lt posterior rotation   Abdominal: c-section scar tissue restriction and sensitivity; good breathing pattern and sternocostal angle; significant tenderness at pubic symphysis and throughout abdomen                External Perineal Exam: over clothing - felt for pelvic floor muscle contraction and she can actively contract and relax pelvic floor muscles                             Internal Pelvic Floor: deferred  Patient confirms identification and approves PT to assess internal pelvic floor and treatment Yes - in the future - we will wait for her to be at least 6 weeks postpartum to perform internal pelvic floor muscle assessment 09/11/24 requests to wait, as she is not having pelvic symptoms currently  PELVIC MMT: deferred   MMT eval  Vaginal   Internal Anal Sphincter   External Anal Sphincter   Puborectalis   Diastasis Recti   (Blank rows = not tested)        TONE: deferred  PROLAPSE: deferred  TODAY'S TREATMENT:                                                                                                                              DATE:     08/16/24: NMRE: all exercises for exhale and pelvic floor contraction with activity for improved coordination of muscle activation and decreased stress at pelvic floor for  decreased leakage.  Hooklying transverse abdominis activation activation x20 - max cues to improve techniques then coordination with breathing Hooklying red loop hip abduction 2x10 Hooklying red loop hip flexion alt 2x10  Hooklying red band bil shoulder extension 2x10  Sidelying ball press with hip abduction 2x10 Seated opposite hand/knee ball press 2x10  Educated on log rolling and pre-contraction of transverse abdominis for rolling and transfers to decreased pain  08/29/24: Assessed DRA separation - in standing notable  bulge above umbilicus. In supine with active sit up test - 3 finger width separation where bulge was noted, does taper to 2 to 1 finger width below umbilicus. Poor muscle density  Supine x20 transverse abdominis activation - moderate cues for techniques  Opposite hand/knee ball press 2x10 improved techniques with towel roll at low back for feedback for transverse abdominis activation activation Bridges with hip adduction 2x10 + transverse abdominis activation and exhale Sidelying hip abduction with ball press 2x10  Cat/cow x10 Thread the needle x10 each K-tape x2 X on lina alba above and at umbilicus for improved tissue approximation postpartum with DRA separation.  Hooklying transverse abdominis activation with alt marching 2x10 Seated green band anti rotations 2x10 with transverse abdominis activation activations  each hand  09/11/24: Hooklying opposite hand/knee ball press with black loop at knees for increased resistance 2x10 Hooklying opposite single arm chest press with march black loop 2x10 each - transverse abdominis activation and exhale for all  Sidelying black loop clams with ball press 2x10 each Hooklying blue band bil pull downs with transverse abdominis activation and exhale each rep x10 Hooklying unilateral pull down to opposite march blue band x10 each with transverse abdominis activation and exhale each Fire hydrant blue band 2x10 each with exhale  Sit to stands 12# 2x10 (first round with hand weights, second round with baby for resistance) - exhale and pelvic floor activation Standing alt marching 2s single leg stand with holding baby at chest 2x10 each   PATIENT EDUCATION:  Education details: See above Person educated: Patient Education method: Explanation, Demonstration, Tactile cues, Verbal cues, and Handouts Education comprehension: verbalized understanding  HOME EXERCISE PROGRAM: [id number]  ASSESSMENT:  CLINICAL IMPRESSION: Patient is a 28 y.o. female who was  seen today for physical therapy treatment for postpartum abdominal weakness. Pt tolerated session well but does need moderate verbal cues and tactile cues for core activation and coordination with breathing. Benefited from these cues for decreased abdominal distortion though much better control with less distortion today. NMRE utilized for coordination and retraining abdominal and hip muscle for improved stability, decreased pain, progress for pt to return to running. Pt to progressed well  She will continue to benefit from skilled PT intervention in order to decrease abdominal/pelvic pain, improve core strength, be able to walk without difficulty, improve quality of life, and progress functional strengthening program.   OBJECTIVE IMPAIRMENTS: decreased activity tolerance, decreased coordination, decreased endurance, decreased mobility, decreased ROM, decreased strength, increased fascial restrictions, increased muscle spasms, impaired flexibility, impaired tone, improper body mechanics, postural dysfunction, and pain.   ACTIVITY LIMITATIONS: lifting, bending, standing, squatting, and walking  PARTICIPATION LIMITATIONS: interpersonal relationship, community activity, and exercise, caring for child  PERSONAL FACTORS: 1 comorbidity: medical history are also affecting patient's functional outcome.   REHAB POTENTIAL: Good  CLINICAL DECISION MAKING: Stable/uncomplicated  EVALUATION COMPLEXITY: Low   GOALS: Goals reviewed with patient? Yes  SHORT TERM GOALS: Target date: 08/31/2024   Pt will be independent with HEP in order to improve activity tolerance.  Baseline: Goal status: MET  2.  Pt will be independent with use of squatty potty, relaxed toileting mechanics, and improved bowel movement techniques in order to increase ease of bowel movements and complete evacuation.    Baseline: incomplete emptying and 1-2 bowel movements a week with strianing Goal status: MET  3.  Patient will report  25% improve in pelvic pain in order to increase activity tolerance.    Baseline: 3/10 Goal status: MET  4.  Pt will be able to walk 1.5 miles without pelvic pain.  Baseline: walking 1 mile with pain Goal status: MET    LONG TERM GOALS: Target date: 01/18/2025  Pt will be independent with advanced HEP in order to improve activity tolerance.   Baseline:  Goal status: on going  2.  Patient will report 75% improve in pelvic pain in order to increase activity tolerance.   Baseline: 3/10 Goal status: on going  3.  Patient will be able to walk for 3 miles without pelvic pain.  Baseline: 1 mile with pain Goal status: on going - 2 miles mild soreness  4.  Patient will be able to perform single leg stance with good pelvic stability in order to demonstrate appropriate pelvic floor muscle and transversus abdominus strength and coordination in order to decrease pelvic pain.   Baseline: pelvic drop Goal status: on going  5.  Pt will be able to perform active straight leg bil in order to demonstrate appropriate pelvic stability that will allow her to care for her son without difficulty.  Baseline: cannot lift either LE without support Goal status: on going    PLAN:  PT FREQUENCY: 1-2x/week  PT DURATION: 12 visits    PLANNED INTERVENTIONS: 97164- PT Re-evaluation, 97110-Therapeutic exercises, 97530- Therapeutic activity, 97112- Neuromuscular re-education, 97535- Self Care, 02859- Manual therapy, [phone]- Gait training, [phone]- Aquatic Therapy, [phone]- Electrical stimulation (unattended), [phone]- Traction (mechanical), [id number]- Ionotophoresis 4mg /ml Dexamethasone , 79439 (1-2 muscles), 20561 (3+ muscles)- Dry Needling, Patient/Family education, Balance training, Taping, Joint mobilization, Joint manipulation, Spinal manipulation, Spinal mobilization, Scar mobilization, Vestibular training, Cryotherapy, Moist heat, and Biofeedback  PLAN FOR NEXT SESSION: core strengthening, progress to more  standing, single leg     Darryle Navy, PT, DPT 09/11/2509:11 AM  [facility] [address], [address] [place], [place] [address] Phone # [phone] Fax [phone]

## 2024-09-18 ENCOUNTER — Ambulatory Visit

## 2024-09-25 ENCOUNTER — Ambulatory Visit: Admitting: Physical Therapy

## 2024-09-25 DIAGNOSIS — R293 Abnormal posture: Secondary | ICD-10-CM

## 2024-09-25 DIAGNOSIS — R279 Unspecified lack of coordination: Secondary | ICD-10-CM

## 2024-09-25 DIAGNOSIS — M6281 Muscle weakness (generalized): Secondary | ICD-10-CM

## 2024-09-25 NOTE — Therapy (Signed)
 OUTPATIENT PHYSICAL THERAPY FEMALE PELVIC TREATMENT   Patient Name: Jane Padilla MRN: [id number] DOB:07-17-1996, 28 y.o., female Today's Date: 09/25/2024  END OF SESSION:  PT End of Session - 09/25/24 1454     Visit Number 5    Date for Recertification  01/18/25    Authorization Type [employer]et[employer]    PT Start Time 1451    PT Stop Time 1529    PT Time Calculation (min) 38 min    Activity Tolerance Patient tolerated treatment well    Behavior During Therapy [facility] for tasks assessed/performed          Past Medical History:  Diagnosis Date   Anemia    Cesarean delivery delivered 07/06/2024   Vaginal Pap smear, abnormal    Past Surgical History:  Procedure Laterality Date   CESAREAN SECTION N/A 07/04/2024   Procedure: CESAREAN DELIVERY;  Surgeon: Nicholaus Burnard HERO, MD;  Location: [facility] LD ORS;  Service: Obstetrics;  Laterality: N/A;   OVARIAN CYST REMOVAL     Patient Active Problem List   Diagnosis Date Noted   Cesarean delivery delivered 07/06/2024   Group B Streptococcus carrier, antepartum 06/11/2024   Sickle cell trait 02/10/2024   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   Irritable bowel syndrome with constipation 09/16/2021   Rosacea 09/16/2021   Migraines 08/02/2014    PCP: NA  REFERRING PROVIDER: Vannie Cornell SAUNDERS, CNM   REFERRING DIAG: K77.8 (ICD-10-CM) - Hernia of pelvic floor  THERAPY DIAG:  Muscle weakness (generalized)  Abnormal posture  Unspecified lack of coordination  Rationale for Evaluation and Treatment: Rehabilitation  ONSET DATE: 07/04/24  SUBJECTIVE:                                                                                                                                                                                           SUBJECTIVE STATEMENT: I feel like I am getting stronger. I don't think the hernia is getting any smaller but no pain and no worse with activity now. Bowel movements are getting better since birthing baby now at least 3x weekly.     PAIN:  Are you having pain? No NPRS scale: 0/10 Pain location: pelvic   Pain type: aching Pain description: intermittent   Aggravating factors: walking, open legs  Relieving factors: rest  PRECAUTIONS: None  RED FLAGS: None   WEIGHT BEARING RESTRICTIONS: No  FALLS:  Has patient fallen in last 6 months? No  OCCUPATION: behavior analyst   ACTIVITY LEVEL : just started walking a mile a day this week  PLOF: Independent  PATIENT GOALS: decrease pain, rebuild pelvic floor, build up core strength  PERTINENT HISTORY:  Ovarian  cyst removal; c-section 07/04/2024   BOWEL MOVEMENT: Pain with bowel movement: No Type of bowel movement:Type (Bristol Stool Scale) 3-4, Frequency 1-2x/week - always been like that, and Strain yes Fully empty rectum: Yes: sometimes Leakage: No Pads: No Fiber supplement/laxative No  URINATION: Pain with urination: Yes - sometimes will pinch when she urinates  Fully empty bladder: Yes: - Stream: Strong Urgency: No Frequency: WNL Fluid Intake: 64oz of water  a day Leakage: none Pads: No  INTERCOURSE:  Has not returned to intercourse, some pain with certain positions   PREGNANCY: Vaginal deliveries 0  C-section deliveries 1 Currently pregnant No  PROLAPSE: None   OBJECTIVE:  Note: Objective measures were completed at Evaluation unless otherwise noted.   PATIENT SURVEYS:   PFIQ-7: 35  COGNITION: Overall cognitive status: Within functional limits for tasks assessed     SENSATION: Light touch: Appears intact   FUNCTIONAL TESTS:  Squat: Abdominal discomfort Single leg stance:  Rt: pelvic drop   Lt: stable Curl-up test: abdominal distortion  ACTIVE STRAIGHT LEG RAISE: (+) bil - could not lift either LE    GAIT: Assistive device utilized: None Comments: WNL  POSTURE: rounded shoulders, forward head, and anterior pelvic tilt   LUMBARAROM/PROM:  A/PROM A/PROM  Eval (% available)  Flexion 100  Extension 10 - pull  at incision, nervous  Right lateral flexion WNL  Left lateral flexion WNL  Right rotation 75, pull  Left rotation 75, pull   (Blank rows = not tested)  PALPATION:  Pelvic Alignment: Lt posterior rotation   Abdominal: c-section scar tissue restriction and sensitivity; good breathing pattern and sternocostal angle; significant tenderness at pubic symphysis and throughout abdomen                External Perineal Exam: over clothing - felt for pelvic floor muscle contraction and she can actively contract and relax pelvic floor muscles                             Internal Pelvic Floor: deferred  Patient confirms identification and approves PT to assess internal pelvic floor and treatment Yes - in the future - we will wait for her to be at least 6 weeks postpartum to perform internal pelvic floor muscle assessment 09/11/24 requests to wait, as she is not having pelvic symptoms currently  PELVIC MMT: deferred   MMT eval  Vaginal   Internal Anal Sphincter   External Anal Sphincter   Puborectalis   Diastasis Recti   (Blank rows = not tested)        TONE: deferred  PROLAPSE: deferred  TODAY'S TREATMENT:                                                                                                                              DATE:    08/29/24: Assessed DRA separation - in standing notable bulge above umbilicus. In supine with active sit  up test - 3 finger width separation where bulge was noted, does taper to 2 to 1 finger width below umbilicus. Poor muscle density  Supine x20 transverse abdominis activation - moderate cues for techniques  Opposite hand/knee ball press 2x10 improved techniques with towel roll at low back for feedback for transverse abdominis activation activation Bridges with hip adduction 2x10 + transverse abdominis activation and exhale Sidelying hip abduction with ball press 2x10  Cat/cow x10 Thread the needle x10 each K-tape x2 X on lina alba above and at  umbilicus for improved tissue approximation postpartum with DRA separation.  Hooklying transverse abdominis activation with alt marching 2x10 Seated green band anti rotations 2x10 with transverse abdominis activation activations  each hand  09/11/24: Hooklying opposite hand/knee ball press with black loop at knees for increased resistance 2x10 Hooklying opposite single arm chest press with march black loop 2x10 each - transverse abdominis activation and exhale for all  Sidelying black loop clams with ball press 2x10 each Hooklying blue band bil pull downs with transverse abdominis activation and exhale each rep x10 Hooklying unilateral pull down to opposite march blue band x10 each with transverse abdominis activation and exhale each Fire hydrant blue band 2x10 each with exhale  Sit to stands 12# 2x10 (first round with hand weights, second round with baby for resistance) - exhale and pelvic floor activation Standing alt marching 2s single leg stand with holding baby at chest 2x10 each  09/25/24: Width of DRA reassessed in hooklying with scapula off table with 2-3-2 finger width separation noted with activation, with transverse abdominis activation cued decreased by 0.5 finger widths throughout.  In standing, noted bulge at widest separation which pt reports is where she feels hernia. With tactile cues for improved pressure management and transverse abdominis activation no bulge noted.  2x10 ball squeezes in hand standing with transverse abdominis activation and exhale no bulge noted Green band bil shoulder horizontal abduction with exhale and transverse abdominis activation 2x10 Standing alt marching 2x10 transverse abdominis activation and exhale' Standing pelvic floor activation with exhale x10 Standing pelvic floor isometrics 10x10s Hooklying blue band alt marching blue band 2x10  Bridges with hip abduction blue band 2x10 Sidelying clams blue band with transverse abdominis activation and  exhale 2x10  PATIENT EDUCATION:  Education details: See above Person educated: Patient Education method: Explanation, Demonstration, Tactile cues, Verbal cues, and Handouts Education comprehension: verbalized understanding  HOME EXERCISE PROGRAM: [id number]  ASSESSMENT:  CLINICAL IMPRESSION: Patient is a 28 y.o. female who was seen today for physical therapy treatment for postpartum abdominal weakness. Pt to progressed well, pt had baby today and did need to hold or have baby on chest for most of session but able to participate with these modifications. Is now tolerating 2 mile walks without pain, however does still demonstrate abdominal distortion with standing strength exercises, and more complex/dynamic hooklying or seated exercises. Pt did have improved separation of DRA to 2-3-2 finger width separation with scaps off mat, but unable to complete full sit up test however now 1/3.  She will continue to benefit from skilled PT intervention in order to improve core strength, improve quality of life, and progress functional strengthening program.   OBJECTIVE IMPAIRMENTS: decreased activity tolerance, decreased coordination, decreased endurance, decreased mobility, decreased ROM, decreased strength, increased fascial restrictions, increased muscle spasms, impaired flexibility, impaired tone, improper body mechanics, postural dysfunction, and pain.   ACTIVITY LIMITATIONS: lifting, bending, standing, squatting, and walking  PARTICIPATION LIMITATIONS: interpersonal relationship, community activity, and exercise, caring  for child  PERSONAL FACTORS: 1 comorbidity: medical history are also affecting patient's functional outcome.   REHAB POTENTIAL: Good  CLINICAL DECISION MAKING: Stable/uncomplicated  EVALUATION COMPLEXITY: Low   GOALS: Goals reviewed with patient? Yes  SHORT TERM GOALS: Target date: 08/31/2024   Pt will be independent with HEP in order to improve activity tolerance.    Baseline: Goal status: MET  2.  Pt will be independent with use of squatty potty, relaxed toileting mechanics, and improved bowel movement techniques in order to increase ease of bowel movements and complete evacuation.    Baseline: incomplete emptying and 1-2 bowel movements a week with strianing Goal status: MET  3.  Patient will report 25% improve in pelvic pain in order to increase activity tolerance.    Baseline: 3/10 Goal status: MET  4.  Pt will be able to walk 1.5 miles without pelvic pain.  Baseline: walking 1 mile with pain Goal status: MET    LONG TERM GOALS: Target date: 01/18/2025  Pt will be independent with advanced HEP in order to improve activity tolerance.   Baseline:  Goal status: on going  2.  Patient will report 75% improve in pelvic pain in order to increase activity tolerance.   Baseline: 3/10 Goal status: on going  3.  Patient will be able to walk for 3 miles without pelvic pain.  Baseline: 1 mile with pain Goal status: on going - 2 miles mild soreness  4.  Patient will be able to perform single leg stance with good pelvic stability in order to demonstrate appropriate pelvic floor muscle and transversus abdominus strength and coordination in order to decrease pelvic pain.   Baseline: pelvic drop Goal status: on going  5.  Pt will be able to perform active straight leg bil in order to demonstrate appropriate pelvic stability that will allow her to care for her son without difficulty.  Baseline: cannot lift either LE without support Goal status: on going    PLAN:  PT FREQUENCY: 1-2x/week  PT DURATION: 12 visits    PLANNED INTERVENTIONS: 97164- PT Re-evaluation, 97110-Therapeutic exercises, 97530- Therapeutic activity, 97112- Neuromuscular re-education, 97535- Self Care, 02859- Manual therapy, [phone]- Gait training, [phone]- Aquatic Therapy, [phone]- Electrical stimulation (unattended), [phone]- Traction (mechanical), [id number]- Ionotophoresis 4mg /ml  Dexamethasone , 79439 (1-2 muscles), 20561 (3+ muscles)- Dry Needling, Patient/Family education, Balance training, Taping, Joint mobilization, Joint manipulation, Spinal manipulation, Spinal mobilization, Scar mobilization, Vestibular training, Cryotherapy, Moist heat, and Biofeedback  PLAN FOR NEXT SESSION: core strengthening, progress to more standing, single leg     Darryle Navy, PT, DPT 09/29/257:01 PM  [facility] [address], [address] [place], [place] [address] Phone # [phone] Fax [phone]

## 2024-10-04 ENCOUNTER — Ambulatory Visit: Attending: Certified Nurse Midwife | Admitting: Physical Therapy

## 2024-10-04 DIAGNOSIS — R293 Abnormal posture: Secondary | ICD-10-CM | POA: Diagnosis present

## 2024-10-04 DIAGNOSIS — M6281 Muscle weakness (generalized): Secondary | ICD-10-CM | POA: Diagnosis present

## 2024-10-04 DIAGNOSIS — R102 Pelvic and perineal pain unspecified side: Secondary | ICD-10-CM | POA: Diagnosis present

## 2024-10-04 DIAGNOSIS — R279 Unspecified lack of coordination: Secondary | ICD-10-CM | POA: Insufficient documentation

## 2024-10-04 NOTE — Therapy (Signed)
 OUTPATIENT PHYSICAL THERAPY FEMALE PELVIC TREATMENT   Patient Name: Jane Padilla MRN: [id number] DOB:1996/12/17, 28 y.o., female Today's Date: 10/04/2024  END OF SESSION:  PT End of Session - 10/04/24 0811     Visit Number 6    Number of Visits 12    Date for Recertification  01/18/25    Authorization Type [employer]    PT Start Time 0809   arrival time   PT Stop Time 0844    PT Time Calculation (min) 35 min    Activity Tolerance Patient tolerated treatment well    Behavior During Therapy [facility] for tasks assessed/performed          Past Medical History:  Diagnosis Date   Anemia    Cesarean delivery delivered 07/06/2024   Vaginal Pap smear, abnormal    Past Surgical History:  Procedure Laterality Date   CESAREAN SECTION N/A 07/04/2024   Procedure: CESAREAN DELIVERY;  Surgeon: Nicholaus Burnard HERO, MD;  Location: [facility] LD ORS;  Service: Obstetrics;  Laterality: N/A;   OVARIAN CYST REMOVAL     Patient Active Problem List   Diagnosis Date Noted   Cesarean delivery delivered 07/06/2024   Group B Streptococcus carrier, antepartum 06/11/2024   Sickle cell trait 02/10/2024   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   Irritable bowel syndrome with constipation 09/16/2021   Rosacea 09/16/2021   Migraines 08/02/2014    PCP: NA  REFERRING PROVIDER: Vannie Cornell SAUNDERS, CNM   REFERRING DIAG: K4.8 (ICD-10-CM) - Hernia of pelvic floor  THERAPY DIAG:  Muscle weakness (generalized)  Abnormal posture  Unspecified lack of coordination  Pelvic pain  Rationale for Evaluation and Treatment: Rehabilitation  ONSET DATE: 07/04/24  SUBJECTIVE:                                                                                                                                                                                           SUBJECTIVE STATEMENT: No pain, bowel movements still 3x weekly, wants to jog and struggles to do this, a lot of effort with maintaining stroller down hills.    PAIN:  Are you  having pain? No NPRS scale: 0/10 Pain location: pelvic   Pain type: aching Pain description: intermittent   Aggravating factors: walking, open legs  Relieving factors: rest  PRECAUTIONS: None  RED FLAGS: None   WEIGHT BEARING RESTRICTIONS: No  FALLS:  Has patient fallen in last 6 months? No  OCCUPATION: behavior analyst   ACTIVITY LEVEL : just started walking a mile a day this week  PLOF: Independent  PATIENT GOALS: decrease pain, rebuild pelvic floor, build up core strength  PERTINENT HISTORY:  Ovarian cyst  removal; c-section 07/04/2024   BOWEL MOVEMENT: Pain with bowel movement: No Type of bowel movement:Type (Bristol Stool Scale) 3-4, Frequency 1-2x/week - always been like that, and Strain yes Fully empty rectum: Yes: sometimes Leakage: No Pads: No Fiber supplement/laxative No  URINATION: Pain with urination: Yes - sometimes will pinch when she urinates  Fully empty bladder: Yes: - Stream: Strong Urgency: No Frequency: WNL Fluid Intake: 64oz of water  a day Leakage: none Pads: No  INTERCOURSE:  Has not returned to intercourse, some pain with certain positions   PREGNANCY: Vaginal deliveries 0  C-section deliveries 1 Currently pregnant No  PROLAPSE: None   OBJECTIVE:  Note: Objective measures were completed at Evaluation unless otherwise noted.   PATIENT SURVEYS:   PFIQ-7: 84  COGNITION: Overall cognitive status: Within functional limits for tasks assessed     SENSATION: Light touch: Appears intact   FUNCTIONAL TESTS:  Squat: Abdominal discomfort Single leg stance:  Rt: pelvic drop   Lt: stable Curl-up test: abdominal distortion  ACTIVE STRAIGHT LEG RAISE: (+) bil - could not lift either LE    GAIT: Assistive device utilized: None Comments: WNL  POSTURE: rounded shoulders, forward head, and anterior pelvic tilt   LUMBARAROM/PROM:  A/PROM A/PROM  Eval (% available)  Flexion 100  Extension 10 - pull at incision, nervous   Right lateral flexion WNL  Left lateral flexion WNL  Right rotation 75, pull  Left rotation 75, pull   (Blank rows = not tested)  PALPATION:  Pelvic Alignment: Lt posterior rotation   Abdominal: c-section scar tissue restriction and sensitivity; good breathing pattern and sternocostal angle; significant tenderness at pubic symphysis and throughout abdomen                External Perineal Exam: over clothing - felt for pelvic floor muscle contraction and she can actively contract and relax pelvic floor muscles                             Internal Pelvic Floor: deferred  Patient confirms identification and approves PT to assess internal pelvic floor and treatment Yes - in the future - we will wait for her to be at least 6 weeks postpartum to perform internal pelvic floor muscle assessment 09/11/24 requests to wait, as she is not having pelvic symptoms currently  PELVIC MMT: deferred   MMT eval  Vaginal   Internal Anal Sphincter   External Anal Sphincter   Puborectalis   Diastasis Recti   (Blank rows = not tested)        TONE: deferred  PROLAPSE: deferred  TODAY'S TREATMENT:                                                                                                                              DATE:   09/11/24: Hooklying opposite hand/knee ball press with black loop at knees for increased resistance 2x10 Hooklying opposite single  arm chest press with march black loop 2x10 each - transverse abdominis activation and exhale for all  Sidelying black loop clams with ball press 2x10 each Hooklying blue band bil pull downs with transverse abdominis activation and exhale each rep x10 Hooklying unilateral pull down to opposite march blue band x10 each with transverse abdominis activation and exhale each Fire hydrant blue band 2x10 each with exhale  Sit to stands 12# 2x10 (first round with hand weights, second round with baby for resistance) - exhale and pelvic floor  activation Standing alt marching 2s single leg stand with holding baby at chest 2x10 each  09/25/24: Width of DRA reassessed in hooklying with scapula off table with 2-3-2 finger width separation noted with activation, with transverse abdominis activation cued decreased by 0.5 finger widths throughout.  In standing, noted bulge at widest separation which pt reports is where she feels hernia. With tactile cues for improved pressure management and transverse abdominis activation no bulge noted.  2x10 ball squeezes in hand standing with transverse abdominis activation and exhale no bulge noted Green band bil shoulder horizontal abduction with exhale and transverse abdominis activation 2x10 Standing alt marching 2x10 transverse abdominis activation and exhale' Standing pelvic floor activation with exhale x10 Standing pelvic floor isometrics 10x10s Hooklying blue band alt marching blue band 2x10  Bridges with hip abduction blue band 2x10 Sidelying clams blue band with transverse abdominis activation and exhale 2x10  10/04/24: 4# seated opposite hand/knee press with full opening 2x10 each Bridges 10# 2x10 Sidelying hip Abduction black loop 2x10  Bear plank x10 + 1x17s hold (pt max and did have bulge with last 1-2s of this) Squats 9# 2x10 Standing cross body crunches 9# x10 each   PATIENT EDUCATION:  Education details: See above Person educated: Patient Education method: Explanation, Demonstration, Tactile cues, Verbal cues, and Handouts Education comprehension: verbalized understanding  HOME EXERCISE PROGRAM: [id number]  ASSESSMENT:  CLINICAL IMPRESSION: Patient is a 28 y.o. female who was seen today for physical therapy treatment for postpartum abdominal weakness. Pt continues to progress with more challenging exercises without worsening abdominal distortion or compensation however this does still occur with exercise too hard or with fatigue at end of days or long walks per pt. She will  continue to benefit from skilled PT intervention in order to improve core strength, improve quality of life, and progress functional strengthening program.   OBJECTIVE IMPAIRMENTS: decreased activity tolerance, decreased coordination, decreased endurance, decreased mobility, decreased ROM, decreased strength, increased fascial restrictions, increased muscle spasms, impaired flexibility, impaired tone, improper body mechanics, postural dysfunction, and pain.   ACTIVITY LIMITATIONS: lifting, bending, standing, squatting, and walking  PARTICIPATION LIMITATIONS: interpersonal relationship, community activity, and exercise, caring for child  PERSONAL FACTORS: 1 comorbidity: medical history are also affecting patient's functional outcome.   REHAB POTENTIAL: Good  CLINICAL DECISION MAKING: Stable/uncomplicated  EVALUATION COMPLEXITY: Low   GOALS: Goals reviewed with patient? Yes  SHORT TERM GOALS: Target date: 08/31/2024   Pt will be independent with HEP in order to improve activity tolerance.   Baseline: Goal status: MET  2.  Pt will be independent with use of squatty potty, relaxed toileting mechanics, and improved bowel movement techniques in order to increase ease of bowel movements and complete evacuation.    Baseline: incomplete emptying and 1-2 bowel movements a week with strianing Goal status: MET  3.  Patient will report 25% improve in pelvic pain in order to increase activity tolerance.    Baseline: 3/10 Goal status: MET  4.  Pt will be able to walk 1.5 miles without pelvic pain.  Baseline: walking 1 mile with pain Goal status: MET    LONG TERM GOALS: Target date: 01/18/2025  Pt will be independent with advanced HEP in order to improve activity tolerance.   Baseline:  Goal status: on going  2.  Patient will report 75% improve in pelvic pain in order to increase activity tolerance.   Baseline: 3/10 Goal status: on going  3.  Patient will be able to walk for 3  miles without pelvic pain.  Baseline: 1 mile with pain Goal status: on going - 2 miles mild soreness  4.  Patient will be able to perform single leg stance with good pelvic stability in order to demonstrate appropriate pelvic floor muscle and transversus abdominus strength and coordination in order to decrease pelvic pain.   Baseline: pelvic drop Goal status: on going  5.  Pt will be able to perform active straight leg bil in order to demonstrate appropriate pelvic stability that will allow her to care for her son without difficulty.  Baseline: cannot lift either LE without support Goal status: on going    PLAN:  PT FREQUENCY: 1-2x/week  PT DURATION: 12 visits    PLANNED INTERVENTIONS: 97164- PT Re-evaluation, 97110-Therapeutic exercises, 97530- Therapeutic activity, 97112- Neuromuscular re-education, 97535- Self Care, 02859- Manual therapy, [phone]- Gait training, [phone]- Aquatic Therapy, [phone]- Electrical stimulation (unattended), [phone]- Traction (mechanical), [id number]- Ionotophoresis 4mg /ml Dexamethasone , 79439 (1-2 muscles), 20561 (3+ muscles)- Dry Needling, Patient/Family education, Balance training, Taping, Joint mobilization, Joint manipulation, Spinal manipulation, Spinal mobilization, Scar mobilization, Vestibular training, Cryotherapy, Moist heat, and Biofeedback  PLAN FOR NEXT SESSION: core strengthening, progress to more standing, single leg     Darryle Navy, PT, DPT 10/08/258:45 AM  [facility] [address] [place], [place] [address] Phone # [phone] Fax [phone]

## 2024-10-11 ENCOUNTER — Encounter: Admitting: Physical Therapy

## 2024-10-15 ENCOUNTER — Encounter: Payer: Self-pay | Admitting: Certified Nurse Midwife

## 2024-10-16 ENCOUNTER — Telehealth: Payer: Self-pay | Admitting: Lactation Services

## 2024-10-16 NOTE — Telephone Encounter (Signed)
 Calling [employer] to respond to MyChart message regarding milk supply concerns. Called once, no answer, left message. Follow up as needed. Vermell Pelt IBCLC and [employer] IBCLC

## 2024-10-18 ENCOUNTER — Ambulatory Visit: Admitting: Physical Therapy

## 2024-10-24 ENCOUNTER — Ambulatory Visit: Payer: Self-pay | Admitting: Physical Therapy

## 2024-10-24 DIAGNOSIS — M6281 Muscle weakness (generalized): Secondary | ICD-10-CM | POA: Diagnosis not present

## 2024-10-24 DIAGNOSIS — R279 Unspecified lack of coordination: Secondary | ICD-10-CM

## 2024-10-24 DIAGNOSIS — R293 Abnormal posture: Secondary | ICD-10-CM

## 2024-10-24 NOTE — Therapy (Signed)
 OUTPATIENT PHYSICAL THERAPY FEMALE PELVIC TREATMENT   Patient Name: Jane Padilla MRN: [id number] DOB:June 04, 1996, 28 y.o., female Today's Date: 10/24/2024  END OF SESSION:  PT End of Session - 10/24/24 1015     Visit Number 7    Number of Visits 12    Date for Recertification  01/18/25    Authorization Type Aetna    PT Start Time 1016    PT Stop Time 1055    PT Time Calculation (min) 39 min    Activity Tolerance Patient tolerated treatment well    Behavior During Therapy [facility] for tasks assessed/performed          Past Medical History:  Diagnosis Date   Anemia    Cesarean delivery delivered 07/06/2024   Vaginal Pap smear, abnormal    Past Surgical History:  Procedure Laterality Date   CESAREAN SECTION N/A 07/04/2024   Procedure: CESAREAN DELIVERY;  Surgeon: Nicholaus Burnard HERO, MD;  Location: [facility] LD ORS;  Service: Obstetrics;  Laterality: N/A;   OVARIAN CYST REMOVAL     Patient Active Problem List   Diagnosis Date Noted   Cesarean delivery delivered 07/06/2024   Group B Streptococcus carrier, antepartum 06/11/2024   Sickle cell trait 02/10/2024   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   Irritable bowel syndrome with constipation 09/16/2021   Rosacea 09/16/2021   Migraines 08/02/2014    PCP: NA  REFERRING PROVIDER: Vannie Cornell SAUNDERS, CNM   REFERRING DIAG: K89.8 (ICD-10-CM) - Hernia of pelvic floor  THERAPY DIAG:  Muscle weakness (generalized)  Abnormal posture  Unspecified lack of coordination  Rationale for Evaluation and Treatment: Rehabilitation  ONSET DATE: 07/04/24  SUBJECTIVE:                                                                                                                                                                                           SUBJECTIVE STATEMENT: Pt had to cancel last two appointments as baby was sick. Abdominal bulge doesn't seem to changing too much, but seeing less leakage overall in amount. With more forceful or strong sneeze  or cough will have drops of leakage but is smaller wont.  Bowels are moving well.  Not sexually active yet   PAIN:  Are you having pain? No NPRS scale: 0/10 Pain location: pelvic   Pain type: aching Pain description: intermittent   Aggravating factors: walking, open legs  Relieving factors: rest  PRECAUTIONS: None  RED FLAGS: None   WEIGHT BEARING RESTRICTIONS: No  FALLS:  Has patient fallen in last 6 months? No  OCCUPATION: behavior analyst   ACTIVITY LEVEL : just started walking a mile a day this  week  PLOF: Independent  PATIENT GOALS: decrease pain, rebuild pelvic floor, build up core strength  PERTINENT HISTORY:  Ovarian cyst removal; c-section 07/04/2024   BOWEL MOVEMENT: Pain with bowel movement: No Type of bowel movement:Type (Bristol Stool Scale) 3-4, Frequency 1-2x/week - always been like that, and Strain yes Fully empty rectum: Yes: sometimes Leakage: No Pads: No Fiber supplement/laxative No  URINATION: Pain with urination: Yes - sometimes will pinch when she urinates  Fully empty bladder: Yes: - Stream: Strong Urgency: No Frequency: WNL Fluid Intake: 64oz of water  a day Leakage: none Pads: No  INTERCOURSE:  Has not returned to intercourse, some pain with certain positions   PREGNANCY: Vaginal deliveries 0  C-section deliveries 1 Currently pregnant No  PROLAPSE: None   OBJECTIVE:  Note: Objective measures were completed at Evaluation unless otherwise noted.   PATIENT SURVEYS:   PFIQ-7: 52  COGNITION: Overall cognitive status: Within functional limits for tasks assessed     SENSATION: Light touch: Appears intact   FUNCTIONAL TESTS:  Squat: Abdominal discomfort Single leg stance:  Rt: pelvic drop   Lt: stable Curl-up test: abdominal distortion  ACTIVE STRAIGHT LEG RAISE: (+) bil - could not lift either LE    GAIT: Assistive device utilized: None Comments: WNL  POSTURE: rounded shoulders, forward head, and anterior  pelvic tilt   LUMBARAROM/PROM:  A/PROM A/PROM  Eval (% available)  Flexion 100  Extension 10 - pull at incision, nervous  Right lateral flexion WNL  Left lateral flexion WNL  Right rotation 75, pull  Left rotation 75, pull   (Blank rows = not tested)  PALPATION:  Pelvic Alignment: Lt posterior rotation   Abdominal: c-section scar tissue restriction and sensitivity; good breathing pattern and sternocostal angle; significant tenderness at pubic symphysis and throughout abdomen                External Perineal Exam: over clothing - felt for pelvic floor muscle contraction and she can actively contract and relax pelvic floor muscles                             Internal Pelvic Floor: deferred  Patient confirms identification and approves PT to assess internal pelvic floor and treatment Yes - in the future - we will wait for her to be at least 6 weeks postpartum to perform internal pelvic floor muscle assessment 09/11/24 requests to wait, as she is not having pelvic symptoms currently  PELVIC MMT: deferred   MMT eval  Vaginal   Internal Anal Sphincter   External Anal Sphincter   Puborectalis   Diastasis Recti   (Blank rows = not tested)        TONE: deferred  PROLAPSE: deferred  TODAY'S TREATMENT:  DATE:   09/25/24: Width of DRA reassessed in hooklying with scapula off table with 2-3-2 finger width separation noted with activation, with transverse abdominis activation cued decreased by 0.5 finger widths throughout.  In standing, noted bulge at widest separation which pt reports is where she feels hernia. With tactile cues for improved pressure management and transverse abdominis activation no bulge noted.  2x10 ball squeezes in hand standing with transverse abdominis activation and exhale no bulge noted Green band bil shoulder horizontal abduction with  exhale and transverse abdominis activation 2x10 Standing alt marching 2x10 transverse abdominis activation and exhale' Standing pelvic floor activation with exhale x10 Standing pelvic floor isometrics 10x10s Hooklying blue band alt marching blue band 2x10  Bridges with hip abduction blue band 2x10 Sidelying clams blue band with transverse abdominis activation and exhale 2x10  10/04/24: 4# seated opposite hand/knee press with full opening 2x10 each Bridges 10# 2x10 Sidelying hip Abduction black loop 2x10  Bear plank x10 + 1x17s hold (pt max and did have bulge with last 1-2s of this) Squats 9# 2x10 Standing cross body crunches 9# x10 each  10/24/24: K tape - x2 x shape with transverse tension 50% above umbilicus for improved tissue approximation for decreased DRA dysfunction Bridges with figure 4 2x10 each Hooklying small power cord 2x10 pull downs with alt marching (towel roll in place at low back for improved feedback of transverse abdominis activation. ) Bird dogs 2x10 Quad opposite pull through 2x10 small power cord Seated anti rotation cross body shoulder to opposite hip 7# 2x10 each Mario punches 7# 2x10 each Cat cow x10  X10 cactus strtech   PATIENT EDUCATION:  Education details: See above Person educated: Patient Education method: Programmer, Multimedia, Demonstration, Actor cues, Verbal cues, and Handouts Education comprehension: verbalized understanding  HOME EXERCISE PROGRAM: [email or web address]  ASSESSMENT:  CLINICAL IMPRESSION: Patient is a 28 y.o. female who was seen today for physical therapy treatment for postpartum abdominal weakness. Pt continues to progress with more challenging exercises without worsening abdominal distortion or compensation however this does still occur with exercise too hard or with fatigue at end of days or long walks per pt. Tolerated without bulge today but did tape upper abs with pt reporting feeling activation much better during session. She will  continue to benefit from skilled PT intervention in order to improve core strength, improve quality of life, and progress functional strengthening program.   OBJECTIVE IMPAIRMENTS: decreased activity tolerance, decreased coordination, decreased endurance, decreased mobility, decreased ROM, decreased strength, increased fascial restrictions, increased muscle spasms, impaired flexibility, impaired tone, improper body mechanics, postural dysfunction, and pain.   ACTIVITY LIMITATIONS: lifting, bending, standing, squatting, and walking  PARTICIPATION LIMITATIONS: interpersonal relationship, community activity, and exercise, caring for child  PERSONAL FACTORS: 1 comorbidity: medical history are also affecting patient's functional outcome.   REHAB POTENTIAL: Good  CLINICAL DECISION MAKING: Stable/uncomplicated  EVALUATION COMPLEXITY: Low   GOALS: Goals reviewed with patient? Yes  SHORT TERM GOALS: Target date: 08/31/2024   Pt will be independent with HEP in order to improve activity tolerance.   Baseline: Goal status: MET  2.  Pt will be independent with use of squatty potty, relaxed toileting mechanics, and improved bowel movement techniques in order to increase ease of bowel movements and complete evacuation.    Baseline: incomplete emptying and 1-2 bowel movements a week with strianing Goal status: MET  3.  Patient will report 25% improve in pelvic pain in order to increase activity tolerance.    Baseline: 3/10 Goal status:  MET  4.  Pt will be able to walk 1.5 miles without pelvic pain.  Baseline: walking 1 mile with pain Goal status: MET    LONG TERM GOALS: Target date: 01/18/2025  Pt will be independent with advanced HEP in order to improve activity tolerance.   Baseline:  Goal status: on going  2.  Patient will report 75% improve in pelvic pain in order to increase activity tolerance.   Baseline: 3/10 Goal status: on going  3.  Patient will be able to walk for 3  miles without pelvic pain.  Baseline: 1 mile with pain Goal status: on going - 2 miles mild soreness  4.  Patient will be able to perform single leg stance with good pelvic stability in order to demonstrate appropriate pelvic floor muscle and transversus abdominus strength and coordination in order to decrease pelvic pain.   Baseline: pelvic drop Goal status: on going  5.  Pt will be able to perform active straight leg bil in order to demonstrate appropriate pelvic stability that will allow her to care for her son without difficulty.  Baseline: cannot lift either LE without support Goal status: on going    PLAN:  PT FREQUENCY: 1-2x/week  PT DURATION: 12 visits    PLANNED INTERVENTIONS: 97164- PT Re-evaluation, 97110-Therapeutic exercises, 97530- Therapeutic activity, 97112- Neuromuscular re-education, 97535- Self Care, 02859- Manual therapy, [phone]- Gait training, [phone]- Aquatic Therapy, [phone]- Electrical stimulation (unattended), [phone]- Traction (mechanical), [id number]- Ionotophoresis 4mg /ml Dexamethasone , 79439 (1-2 muscles), 20561 (3+ muscles)- Dry Needling, Patient/Family education, Balance training, Taping, Joint mobilization, Joint manipulation, Spinal manipulation, Spinal mobilization, Scar mobilization, Vestibular training, Cryotherapy, Moist heat, and Biofeedback  PLAN FOR NEXT SESSION: core strengthening, progress to more standing, single leg     Darryle Navy, PT, DPT 10/24/2509:56 AM  [facility] [address] [place], [place] [address] Phone # [phone] Fax [phone]

## 2024-10-25 ENCOUNTER — Encounter

## 2024-11-09 ENCOUNTER — Ambulatory Visit: Payer: Self-pay | Attending: Certified Nurse Midwife | Admitting: Physical Therapy

## 2024-11-09 DIAGNOSIS — R279 Unspecified lack of coordination: Secondary | ICD-10-CM | POA: Diagnosis present

## 2024-11-09 DIAGNOSIS — R293 Abnormal posture: Secondary | ICD-10-CM | POA: Insufficient documentation

## 2024-11-09 DIAGNOSIS — M6281 Muscle weakness (generalized): Secondary | ICD-10-CM | POA: Diagnosis present

## 2024-11-09 NOTE — Therapy (Signed)
 OUTPATIENT PHYSICAL THERAPY FEMALE PELVIC TREATMENT   Patient Name: Jane Padilla MRN: [id number] DOB:1996/11/12, 28 y.o., female Today's Date: 11/09/2024  END OF SESSION:  PT End of Session - 11/09/24 1238     Visit Number 8    Number of Visits 12    Date for Recertification  01/18/25    Authorization Type [employer]    PT Start Time 1232    PT Stop Time 1312    PT Time Calculation (min) 40 min    Activity Tolerance Patient tolerated treatment well    Behavior During Therapy [facility] for tasks assessed/performed          Past Medical History:  Diagnosis Date   Anemia    Cesarean delivery delivered 07/06/2024   Vaginal Pap smear, abnormal    Past Surgical History:  Procedure Laterality Date   CESAREAN SECTION N/A 07/04/2024   Procedure: CESAREAN DELIVERY;  Surgeon: Nicholaus Burnard HERO, MD;  Location: [facility] LD ORS;  Service: Obstetrics;  Laterality: N/A;   OVARIAN CYST REMOVAL     Patient Active Problem List   Diagnosis Date Noted   Cesarean delivery delivered 07/06/2024   Group B Streptococcus carrier, antepartum 06/11/2024   Sickle cell trait 02/10/2024   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   Irritable bowel syndrome with constipation 09/16/2021   Rosacea 09/16/2021   Migraines 08/02/2014    PCP: NA  REFERRING PROVIDER: Vannie Cornell SAUNDERS, CNM   REFERRING DIAG: K34.8 (ICD-10-CM) - Hernia of pelvic floor  THERAPY DIAG:  Muscle weakness (generalized)  Unspecified lack of coordination  Rationale for Evaluation and Treatment: Rehabilitation  ONSET DATE: 07/04/24  SUBJECTIVE:                                                                                                                                                                                           SUBJECTIVE STATEMENT: Reports bowels are slowing but around the same as prior to pregnancy and this is usually 2x3 times weekly at worst 1x week.  Abdominal bulge about the same, overall strength feels better. Still having  trouble with side to side movements and like a crunch motion with baby. Reports she didn't see she a big different with tape at bulge after last session  Not sexually active yet   PAIN:  Are you having pain? No NPRS scale: 0/10 Pain location: pelvic   Pain type: aching Pain description: intermittent   Aggravating factors: walking, open legs  Relieving factors: rest  PRECAUTIONS: None  RED FLAGS: None   WEIGHT BEARING RESTRICTIONS: No  FALLS:  Has patient fallen in last 6 months? No  OCCUPATION: behavior analyst  ACTIVITY LEVEL : just started walking a mile a day this week  PLOF: Independent  PATIENT GOALS: decrease pain, rebuild pelvic floor, build up core strength  PERTINENT HISTORY:  Ovarian cyst removal; c-section 07/04/2024   BOWEL MOVEMENT: Pain with bowel movement: No Type of bowel movement:Type (Bristol Stool Scale) 3-4, Frequency 1-2x/week - always been like that, and Strain yes Fully empty rectum: Yes: sometimes Leakage: No Pads: No Fiber supplement/laxative No  URINATION: Pain with urination: Yes - sometimes will pinch when she urinates  Fully empty bladder: Yes: - Stream: Strong Urgency: No Frequency: WNL Fluid Intake: 64oz of water  a day Leakage: none Pads: No  INTERCOURSE:  Has not returned to intercourse, some pain with certain positions   PREGNANCY: Vaginal deliveries 0  C-section deliveries 1 Currently pregnant No  PROLAPSE: None   OBJECTIVE:  Note: Objective measures were completed at Evaluation unless otherwise noted.   PATIENT SURVEYS:   PFIQ-7: 82  COGNITION: Overall cognitive status: Within functional limits for tasks assessed     SENSATION: Light touch: Appears intact   FUNCTIONAL TESTS:  Squat: Abdominal discomfort Single leg stance:  Rt: pelvic drop   Lt: stable Curl-up test: abdominal distortion  ACTIVE STRAIGHT LEG RAISE: (+) bil - could not lift either LE    GAIT: Assistive device utilized:  None Comments: WNL  POSTURE: rounded shoulders, forward head, and anterior pelvic tilt   LUMBARAROM/PROM:  A/PROM A/PROM  Eval (% available)  Flexion 100  Extension 10 - pull at incision, nervous  Right lateral flexion WNL  Left lateral flexion WNL  Right rotation 75, pull  Left rotation 75, pull   (Blank rows = not tested)  PALPATION:  Pelvic Alignment: Lt posterior rotation   Abdominal: c-section scar tissue restriction and sensitivity; good breathing pattern and sternocostal angle; significant tenderness at pubic symphysis and throughout abdomen                External Perineal Exam: over clothing - felt for pelvic floor muscle contraction and she can actively contract and relax pelvic floor muscles                             Internal Pelvic Floor: deferred  Patient confirms identification and approves PT to assess internal pelvic floor and treatment Yes - in the future - we will wait for her to be at least 6 weeks postpartum to perform internal pelvic floor muscle assessment 09/11/24 requests to wait, as she is not having pelvic symptoms currently  PELVIC MMT: deferred   MMT eval  Vaginal   Internal Anal Sphincter   External Anal Sphincter   Puborectalis   Diastasis Recti   (Blank rows = not tested)        TONE: deferred  PROLAPSE: deferred  TODAY'S TREATMENT:  DATE:   10/04/24: 4# seated opposite hand/knee press with full opening 2x10 each Bridges 10# 2x10 Sidelying hip Abduction black loop 2x10  Bear plank x10 + 1x17s hold (pt max and did have bulge with last 1-2s of this) Squats 9# 2x10 Standing cross body crunches 9# x10 each  10/24/24: K tape - x2 x shape with transverse tension 50% above umbilicus for improved tissue approximation for decreased DRA dysfunction Bridges with figure 4 2x10 each Hooklying small power cord 2x10  pull downs with alt marching (towel roll in place at low back for improved feedback of transverse abdominis activation. ) Bird dogs 2x10 Quad opposite pull through 2x10 small power cord Seated anti rotation cross body shoulder to opposite hip 7# 2x10 each Mario punches 7# 2x10 each Cat cow x10  X10 cactus strtech  11/09/24: Reassessed DRA separation - does continue to have 2.5-3 finger width separation at largest width which is her bulge location, but much better density noted today Hooklying ball squeeze with transverse abdominis activation and exhale x20 Hooklying opposite hand/knee ball press 2x10 with exhale  Hooklying beginner crunch with just head lifting off table with belt to provide feedback for DRA approximation x20 Hooklying opposite hand knee blue band pull down 2x10 each Hooklying bil shoulder horizontal abduction blue band 2x10 with transverse abdominis activation  Bridges with pallof blue band 2x10  each Hooklying chest press 5# with opposite side held pressure at hand/knee 2x10 each    PATIENT EDUCATION:  Education details: See above Person educated: Patient Education method: Programmer, Multimedia, Demonstration, Tactile cues, Verbal cues, and Handouts Education comprehension: verbalized understanding  HOME EXERCISE PROGRAM: [id number]  ASSESSMENT:  CLINICAL IMPRESSION: Patient is a 28 y.o. female who was seen today for physical therapy treatment for postpartum abdominal weakness. Pt tolerated well today, responded well to belt for feedback with core approximation and had no bulging with this. she will continue to benefit from skilled PT intervention in order to improve core strength, improve quality of life, and progress functional strengthening program.   OBJECTIVE IMPAIRMENTS: decreased activity tolerance, decreased coordination, decreased endurance, decreased mobility, decreased ROM, decreased strength, increased fascial restrictions, increased muscle spasms, impaired  flexibility, impaired tone, improper body mechanics, postural dysfunction, and pain.   ACTIVITY LIMITATIONS: lifting, bending, standing, squatting, and walking  PARTICIPATION LIMITATIONS: interpersonal relationship, community activity, and exercise, caring for child  PERSONAL FACTORS: 1 comorbidity: medical history are also affecting patient's functional outcome.   REHAB POTENTIAL: Good  CLINICAL DECISION MAKING: Stable/uncomplicated  EVALUATION COMPLEXITY: Low   GOALS: Goals reviewed with patient? Yes  SHORT TERM GOALS: Target date: 08/31/2024   Pt will be independent with HEP in order to improve activity tolerance.   Baseline: Goal status: MET  2.  Pt will be independent with use of squatty potty, relaxed toileting mechanics, and improved bowel movement techniques in order to increase ease of bowel movements and complete evacuation.    Baseline: incomplete emptying and 1-2 bowel movements a week with strianing Goal status: MET  3.  Patient will report 25% improve in pelvic pain in order to increase activity tolerance.    Baseline: 3/10 Goal status: MET  4.  Pt will be able to walk 1.5 miles without pelvic pain.  Baseline: walking 1 mile with pain Goal status: MET    LONG TERM GOALS: Target date: 01/18/2025  Pt will be independent with advanced HEP in order to improve activity tolerance.   Baseline:  Goal status: on going  2.  Patient will report  75% improve in pelvic pain in order to increase activity tolerance.   Baseline: 3/10 Goal status: on going  3.  Patient will be able to walk for 3 miles without pelvic pain.  Baseline: 1 mile with pain Goal status: on going - 2 miles mild soreness  4.  Patient will be able to perform single leg stance with good pelvic stability in order to demonstrate appropriate pelvic floor muscle and transversus abdominus strength and coordination in order to decrease pelvic pain.   Baseline: pelvic drop Goal status: on  going  5.  Pt will be able to perform active straight leg bil in order to demonstrate appropriate pelvic stability that will allow her to care for her son without difficulty.  Baseline: cannot lift either LE without support Goal status: on going    PLAN:  PT FREQUENCY: 1-2x/week  PT DURATION: 12 visits    PLANNED INTERVENTIONS: 97164- PT Re-evaluation, 97110-Therapeutic exercises, 97530- Therapeutic activity, 97112- Neuromuscular re-education, 97535- Self Care, 02859- Manual therapy, [phone]- Gait training, [phone]- Aquatic Therapy, [phone]- Electrical stimulation (unattended), [phone]- Traction (mechanical), [id number]- Ionotophoresis 4mg /ml Dexamethasone , 79439 (1-2 muscles), 20561 (3+ muscles)- Dry Needling, Patient/Family education, Balance training, Taping, Joint mobilization, Joint manipulation, Spinal manipulation, Spinal mobilization, Scar mobilization, Vestibular training, Cryotherapy, Moist heat, and Biofeedback  PLAN FOR NEXT SESSION: core strengthening, progress to more standing, single leg     Darryle Navy, PT, DPT 11/13/253:36 PM  [facility] [address] [place], [place] [address] Phone # [phone] Fax [phone]

## 2024-11-15 ENCOUNTER — Other Ambulatory Visit: Payer: Self-pay

## 2024-11-15 ENCOUNTER — Ambulatory Visit: Admitting: Physical Therapy

## 2024-11-15 ENCOUNTER — Ambulatory Visit (INDEPENDENT_AMBULATORY_CARE_PROVIDER_SITE_OTHER): Payer: Self-pay | Admitting: Certified Nurse Midwife

## 2024-11-15 ENCOUNTER — Encounter: Payer: Self-pay | Admitting: Certified Nurse Midwife

## 2024-11-15 VITALS — BP 112/77 | HR 79 | Wt 171.2 lb

## 2024-11-15 DIAGNOSIS — M6281 Muscle weakness (generalized): Secondary | ICD-10-CM | POA: Diagnosis not present

## 2024-11-15 DIAGNOSIS — K429 Umbilical hernia without obstruction or gangrene: Secondary | ICD-10-CM

## 2024-11-15 DIAGNOSIS — R279 Unspecified lack of coordination: Secondary | ICD-10-CM

## 2024-11-15 DIAGNOSIS — R293 Abnormal posture: Secondary | ICD-10-CM

## 2024-11-15 DIAGNOSIS — M25561 Pain in right knee: Secondary | ICD-10-CM

## 2024-11-15 DIAGNOSIS — R4189 Other symptoms and signs involving cognitive functions and awareness: Secondary | ICD-10-CM

## 2024-11-15 NOTE — Patient Instructions (Signed)
 [employer] DHA 830mg -1660mg  daily  - [employer], [employer]

## 2024-11-15 NOTE — Therapy (Signed)
 OUTPATIENT PHYSICAL THERAPY FEMALE PELVIC TREATMENT   Patient Name: Jane Padilla MRN: [id number] DOB:1996-10-23, 28 y.o., female Today's Date: 11/15/2024  END OF SESSION:  PT End of Session - 11/15/24 0851     Visit Number 9    Number of Visits 12    Date for Recertification  01/18/25    Authorization Type [employer]et[employer]    PT Start Time 0848    PT Stop Time 0928    PT Time Calculation (min) 40 min    Activity Tolerance Patient tolerated treatment well    Behavior During Therapy  [facility] for tasks assessed/performed          Past Medical History:  Diagnosis Date   Anemia    Cesarean delivery delivered 07/06/2024   Vaginal Pap smear, abnormal    Past Surgical History:  Procedure Laterality Date   CESAREAN SECTION N/A 07/04/2024   Procedure: CESAREAN DELIVERY;  Surgeon: Nicholaus Burnard HERO, MD;  Location: [facility] LD ORS;  Service: Obstetrics;  Laterality: N/A;   OVARIAN CYST REMOVAL     Patient Active Problem List   Diagnosis Date Noted   Cesarean delivery delivered 07/06/2024   Group B Streptococcus carrier, antepartum 06/11/2024   Sickle cell trait 02/10/2024   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   Irritable bowel syndrome with constipation 09/16/2021   Rosacea 09/16/2021   Migraines 08/02/2014    PCP: NA  REFERRING PROVIDER: Vannie Cornell SAUNDERS, CNM   REFERRING DIAG: K53.8 (ICD-10-CM) - Hernia of pelvic floor  THERAPY DIAG:  Unspecified lack of coordination  Muscle weakness (generalized)  Abnormal posture  Rationale for Evaluation and Treatment: Rehabilitation  ONSET DATE: 07/04/24  SUBJECTIVE:                                                                                                                                                                                           SUBJECTIVE STATEMENT: Pt and family went to [facility] and carried baby at chest and didn't have anything worsening, no pain and no worse bulging noted.   PAIN:  Are you having pain? No NPRS scale:  0/10 Pain location: pelvic   Pain type: aching Pain description: intermittent   Aggravating factors: walking, open legs  Relieving factors: rest  PRECAUTIONS: None  RED FLAGS: None   WEIGHT BEARING RESTRICTIONS: No  FALLS:  Has patient fallen in last 6 months? No  OCCUPATION: behavior analyst   ACTIVITY LEVEL : just started walking a mile a day this week  PLOF: Independent  PATIENT GOALS: decrease pain, rebuild pelvic floor, build up core strength  PERTINENT HISTORY:  Ovarian cyst removal; c-section 07/04/2024   BOWEL MOVEMENT:  Pain with bowel movement: No Type of bowel movement:Type (Bristol Stool Scale) 3-4, Frequency 1-2x/week - always been like that, and Strain yes Fully empty rectum: Yes: sometimes Leakage: No Pads: No Fiber supplement/laxative No  URINATION: Pain with urination: Yes - sometimes will pinch when she urinates  Fully empty bladder: Yes: - Stream: Strong Urgency: No Frequency: WNL Fluid Intake: 64oz of water  a day Leakage: none Pads: No  INTERCOURSE:  Has not returned to intercourse, some pain with certain positions   PREGNANCY: Vaginal deliveries 0  C-section deliveries 1 Currently pregnant No  PROLAPSE: None   OBJECTIVE:  Note: Objective measures were completed at Evaluation unless otherwise noted.   PATIENT SURVEYS:   PFIQ-7: 45  COGNITION: Overall cognitive status: Within functional limits for tasks assessed     SENSATION: Light touch: Appears intact   FUNCTIONAL TESTS:  Squat: Abdominal discomfort Single leg stance:  Rt: pelvic drop   Lt: stable Curl-up test: abdominal distortion  ACTIVE STRAIGHT LEG RAISE: (+) bil - could not lift either LE    GAIT: Assistive device utilized: None Comments: WNL  POSTURE: rounded shoulders, forward head, and anterior pelvic tilt   LUMBARAROM/PROM:  A/PROM A/PROM  Eval (% available)  Flexion 100  Extension 10 - pull at incision, nervous  Right lateral flexion WNL   Left lateral flexion WNL  Right rotation 75, pull  Left rotation 75, pull   (Blank rows = not tested)  PALPATION:  Pelvic Alignment: Lt posterior rotation   Abdominal: c-section scar tissue restriction and sensitivity; good breathing pattern and sternocostal angle; significant tenderness at pubic symphysis and throughout abdomen                External Perineal Exam: over clothing - felt for pelvic floor muscle contraction and she can actively contract and relax pelvic floor muscles                             Internal Pelvic Floor: deferred  Patient confirms identification and approves PT to assess internal pelvic floor and treatment Yes - in the future - we will wait for her to be at least 6 weeks postpartum to perform internal pelvic floor muscle assessment 09/11/24 requests to wait, as she is not having pelvic symptoms currently  PELVIC MMT: deferred   MMT eval  Vaginal   Internal Anal Sphincter   External Anal Sphincter   Puborectalis   Diastasis Recti   (Blank rows = not tested)        TONE: deferred  PROLAPSE: deferred  TODAY'S TREATMENT:                                                                                                                              DATE:   10/04/24: 4# seated opposite hand/knee press with full opening 2x10 each Bridges 10# 2x10 Sidelying hip Abduction black loop 2x10  Bear plank x10 +  1x17s hold (pt max and did have bulge with last 1-2s of this) Squats 9# 2x10 Standing cross body crunches 9# x10 each  10/24/24: K tape - x2 x shape with transverse tension 50% above umbilicus for improved tissue approximation for decreased DRA dysfunction Bridges with figure 4 2x10 each Hooklying small power cord 2x10 pull downs with alt marching (towel roll in place at low back for improved feedback of transverse abdominis activation. ) Bird dogs 2x10 Quad opposite pull through 2x10 small power cord Seated anti rotation cross body shoulder to  opposite hip 7# 2x10 each Mario punches 7# 2x10 each Cat cow x10  X10 cactus strtech  11/09/24: Reassessed DRA separation - does continue to have 2.5-3 finger width separation at largest width which is her bulge location, but much better density noted today Hooklying ball squeeze with transverse abdominis activation and exhale x20 Hooklying opposite hand/knee ball press 2x10 with exhale  Hooklying beginner crunch with just head lifting off table with belt to provide feedback for DRA approximation x20 Hooklying opposite hand knee blue band pull down 2x10 each Hooklying bil shoulder horizontal abduction blue band 2x10 with transverse abdominis activation  Bridges with pallof blue band 2x10  each Hooklying chest press 5# with opposite side held pressure at hand/knee 2x10 each  11/15/24: Manual: Pt in quad - skilled PT providing hands on manual at thoracic and lumbar paraspinals for decreased tension and pull at DRA separation. Also manual with pt in quad lateral trunk flexion with PT providing manual at contralateral side for rib openers and decreased tension with this stretch ,completed bil.  Pt in supine - diaphragm mobility at rib cage for decreased tension at DRA and improved rib mobility and trunk mobility  Pt educated on implementing tailwags, rib mobility with diaphragmatic breathing HEP (gentle overpressure at bil rib cage with exhale).   PATIENT EDUCATION:  Education details: See above Person educated: Patient Education method: Explanation, Demonstration, Tactile cues, Verbal cues, and Handouts Education comprehension: verbalized understanding  HOME EXERCISE PROGRAM: [id number]  ASSESSMENT:  CLINICAL IMPRESSION: Patient is a 28 y.o. female who was seen today for physical therapy treatment for postpartum abdominal weakness. Pt tolerated well today, reported feeling much better at end of session I had no idea how tight that was and how much that was pulling me. Also demonstrated  improved activation of abdominals compared to start of session.  she will continue to benefit from skilled PT intervention in order to improve core strength, improve quality of life, and progress functional strengthening program.   OBJECTIVE IMPAIRMENTS: decreased activity tolerance, decreased coordination, decreased endurance, decreased mobility, decreased ROM, decreased strength, increased fascial restrictions, increased muscle spasms, impaired flexibility, impaired tone, improper body mechanics, postural dysfunction, and pain.   ACTIVITY LIMITATIONS: lifting, bending, standing, squatting, and walking  PARTICIPATION LIMITATIONS: interpersonal relationship, community activity, and exercise, caring for child  PERSONAL FACTORS: 1 comorbidity: medical history are also affecting patient's functional outcome.   REHAB POTENTIAL: Good  CLINICAL DECISION MAKING: Stable/uncomplicated  EVALUATION COMPLEXITY: Low   GOALS: Goals reviewed with patient? Yes  SHORT TERM GOALS: Target date: 08/31/2024   Pt will be independent with HEP in order to improve activity tolerance.   Baseline: Goal status: MET  2.  Pt will be independent with use of squatty potty, relaxed toileting mechanics, and improved bowel movement techniques in order to increase ease of bowel movements and complete evacuation.    Baseline: incomplete emptying and 1-2 bowel movements a week with strianing Goal status: MET  3.  Patient will report 25% improve in pelvic pain in order to increase activity tolerance.    Baseline: 3/10 Goal status: MET  4.  Pt will be able to walk 1.5 miles without pelvic pain.  Baseline: walking 1 mile with pain Goal status: MET    LONG TERM GOALS: Target date: 01/18/2025  Pt will be independent with advanced HEP in order to improve activity tolerance.   Baseline:  Goal status: on going  2.  Patient will report 75% improve in pelvic pain in order to increase activity tolerance.    Baseline: 3/10 Goal status: on going  3.  Patient will be able to walk for 3 miles without pelvic pain.  Baseline: 1 mile with pain Goal status: on going - 2 miles mild soreness  4.  Patient will be able to perform single leg stance with good pelvic stability in order to demonstrate appropriate pelvic floor muscle and transversus abdominus strength and coordination in order to decrease pelvic pain.   Baseline: pelvic drop Goal status: on going  5.  Pt will be able to perform active straight leg bil in order to demonstrate appropriate pelvic stability that will allow her to care for her son without difficulty.  Baseline: cannot lift either LE without support Goal status: on going    PLAN:  PT FREQUENCY: 1-2x/week  PT DURATION: 12 visits    PLANNED INTERVENTIONS: 97164- PT Re-evaluation, 97110-Therapeutic exercises, 97530- Therapeutic activity, 97112- Neuromuscular re-education, 97535- Self Care, 02859- Manual therapy, [phone]- Gait training, [phone]- Aquatic Therapy, [phone]- Electrical stimulation (unattended), [id number]- Traction (mechanical), [id number]- Ionotophoresis 4mg /ml Dexamethasone , 79439 (1-2 muscles), 20561 (3+ muscles)- Dry Needling, Patient/Family education, Balance training, Taping, Joint mobilization, Joint manipulation, Spinal manipulation, Spinal mobilization, Scar mobilization, Vestibular training, Cryotherapy, Moist heat, and Biofeedback  PLAN FOR NEXT SESSION: core strengthening, progress to more standing, single leg, manual at back and abdomen     Darryle Navy, PT, DPT 11/15/2509:46 AM  [facility] [address] [place], [place] [address] Phone # [phone] Fax [phone]

## 2024-11-16 ENCOUNTER — Ambulatory Visit: Payer: Self-pay | Admitting: Certified Nurse Midwife

## 2024-11-16 LAB — VITAMIN D 25 HYDROXY (VIT D DEFICIENCY, FRACTURES): Vit D, 25-Hydroxy: 32.2 ng/mL (ref 30.0–100.0)

## 2024-11-16 NOTE — Progress Notes (Signed)
 History:  [name] is Padilla 28 y.o. G1P1001 who presents to clinic today for continued issues with her umbilical hernia despite pelvic floor physical therapy.  Over the past two weeks (since returning to work), she has noticed an increase in brain fog, some achiness in her knees and Padilla feeling like they are going to buckle. This is worse after she's worked out (for Padilla couple of days). She is eating about twice daily and still breastfeeding. No other physical complaints. No mental health history but is typically very high mental energy (has 3 degrees for example), indicative of potentially underlying neurodivergence.  The following portions of the patient's history were reviewed and updated as appropriate: allergies, current medications, family history, past medical history, social history, past surgical history and problem list.  Review of Systems:  Review of Systems  Constitutional:  Negative for chills, fever and weight loss.  Respiratory:  Negative for cough.   Cardiovascular:  Negative for chest pain.  Gastrointestinal:  Negative for nausea and vomiting.  Musculoskeletal:  Positive for joint pain.  Neurological:  Positive for weakness. Negative for dizziness.  Psychiatric/Behavioral:  Negative for depression.    Objective:  Physical Exam BP 112/77   Pulse 79   Wt 171 lb 3.2 oz (77.7 kg)   LMP 11/14/2024 (Exact Date)   Breastfeeding Yes   BMI 32.35 kg/m  Physical Exam Vitals and nursing note reviewed.  Constitutional:      Appearance: Normal appearance. She is not ill-appearing.  Cardiovascular:     Rate and Rhythm: Normal rate and regular rhythm.  Pulmonary:     Effort: Pulmonary effort is normal.  Musculoskeletal:        General: Normal range of motion.     Cervical back: Normal range of motion.  Skin:    General: Skin is warm.     Capillary Refill: Capillary refill takes less than 2 seconds.  Neurological:     Mental Status: She is alert and oriented to  person, place, and time.  Psychiatric:        Mood and Affect: Mood normal.        Behavior: Behavior normal.      Labs and Imaging No results found for this or any previous visit (from the past 24 hours).  No results found.   Assessment & Plan:  1. Arthralgia of both knees (Primary) - Discussed much higher metabolic needs when breastfeeding and working full-time, especially her high-demand job.   2. Brain fog - Encouraged to eat small regular meals throughout the day to keep her energy up and prevent brain fog, as well as hydration and electrolytes. - Suggest we check her vitamin D level and add DHA daily to support brain health. - If symptoms continue despite lifestyle changes and supplements, will bring her back for additional blood work/evaluation - Vitamin D (25 hydroxy)  3. Umbilical hernia without obstruction and without gangrene - Ambulatory referral to General Surgery  - Ambulatory referral to Physical Therapy  Follow up in 54mo or PRN.  Future Appointments  Date Time Provider Department Center  11/22/2024 10:15 AM Jane Padilla, PT [facility] None  11/29/2024  8:45 AM Jane Padilla, PT [facility] None  12/13/2024  8:45 AM Jane Padilla, PT [facility] None  12/18/2024 12:30 PM Jane Padilla, PT [facility] None  01/01/2025 10:15 AM Jane Padilla, PT [facility] None     Jane Padilla, [place] 11/16/2024 3:36 PM

## 2024-11-22 ENCOUNTER — Ambulatory Visit

## 2024-11-22 NOTE — Therapy (Incomplete)
 OUTPATIENT PHYSICAL THERAPY FEMALE PELVIC TREATMENT   Patient Name: Jane Padilla MRN: [id number] DOB:04-May-1996, 28 y.o., female Today's Date: 11/22/2024  END OF SESSION:    Past Medical History:  Diagnosis Date   Anemia    Cesarean delivery delivered 07/06/2024   Vaginal Pap smear, abnormal    Past Surgical History:  Procedure Laterality Date   CESAREAN SECTION N/A 07/04/2024   Procedure: CESAREAN DELIVERY;  Surgeon: Nicholaus Burnard HERO, MD;  Location: [facility] LD [facility];  Service: Obstetrics;  Laterality: N/A;   OVARIAN CYST REMOVAL     Patient Active Problem List   Diagnosis Date Noted   Cesarean delivery delivered 07/06/2024   Sickle cell trait 02/10/2024   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  Rationale for Evaluation and Treatment: Rehabilitation  ONSET DATE: 07/04/24  SUBJECTIVE:                                                                                                                                                                                           SUBJECTIVE STATEMENT:   PAIN:  Are you having pain? No NPRS scale: 0/10 Pain location: pelvic   Pain type: aching Pain description: intermittent   Aggravating factors: walking, open legs  Relieving factors: rest  PRECAUTIONS: None  RED FLAGS: None   WEIGHT BEARING RESTRICTIONS: No  FALLS:  Has patient fallen in last 6 months? No  OCCUPATION: behavior analyst   ACTIVITY LEVEL : just started walking a mile a day this week  PLOF: Independent  PATIENT GOALS: decrease pain, rebuild pelvic floor, build up core strength  PERTINENT HISTORY:  Ovarian cyst removal; c-section 07/04/2024   BOWEL MOVEMENT: Pain with bowel movement: No Type of bowel  movement:Type (Bristol Stool Scale) 3-4, Frequency 1-2x/week - always been like that, and Strain yes Fully empty rectum: Yes: sometimes Leakage: No Pads: No Fiber supplement/laxative No  URINATION: Pain with urination: Yes - sometimes will pinch when she urinates  Fully empty bladder: Yes: - Stream: Strong Urgency: No Frequency: WNL Fluid Intake: 64oz of water  a day Leakage: none Pads: No  INTERCOURSE:  Has not returned to intercourse, some pain with certain positions   PREGNANCY: Vaginal deliveries 0  C-section deliveries 1 Currently pregnant No  PROLAPSE: None   OBJECTIVE:  Note: Objective measures were completed at Evaluation unless otherwise noted.   PATIENT SURVEYS:   PFIQ-7: 37  COGNITION:  Overall cognitive status: Within functional limits for tasks assessed     SENSATION: Light touch: Appears intact   FUNCTIONAL TESTS:  Squat: Abdominal discomfort Single leg stance:  Rt: pelvic drop   Lt: stable Curl-up test: abdominal distortion  ACTIVE STRAIGHT LEG RAISE: (+) bil - could not lift either LE    GAIT: Assistive device utilized: None Comments: WNL  POSTURE: rounded shoulders, forward head, and anterior pelvic tilt   LUMBARAROM/PROM:  A/PROM A/PROM  Eval (% available)  Flexion 100  Extension 10 - pull at incision, nervous  Right lateral flexion WNL  Left lateral flexion WNL  Right rotation 75, pull  Left rotation 75, pull   (Blank rows = not tested)  PALPATION:  Pelvic Alignment: Lt posterior rotation   Abdominal: c-section scar tissue restriction and sensitivity; good breathing pattern and sternocostal angle; significant tenderness at pubic symphysis and throughout abdomen                External Perineal Exam: over clothing - felt for pelvic floor muscle contraction and she can actively contract and relax pelvic floor muscles                             Internal Pelvic Floor: deferred  Patient confirms identification and approves  PT to assess internal pelvic floor and treatment Yes - in the future - we will wait for her to be at least 6 weeks postpartum to perform internal pelvic floor muscle assessment 09/11/24 requests to wait, as she is not having pelvic symptoms currently  PELVIC MMT: deferred   MMT eval  Vaginal   Internal Anal Sphincter   External Anal Sphincter   Puborectalis   Diastasis Recti   (Blank rows = not tested)        TONE: deferred  PROLAPSE: deferred  TODAY'S TREATMENT:                                                                                                                              DATE:    11/09/24: Reassessed DRA separation - does continue to have 2.5-3 finger width separation at largest width which is her bulge location, but much better density noted today Hooklying ball squeeze with transverse abdominis activation and exhale x20 Hooklying opposite hand/knee ball press 2x10 with exhale  Hooklying beginner crunch with just head lifting off table with belt to provide feedback for DRA approximation x20 Hooklying opposite hand knee blue band pull down 2x10 each Hooklying bil shoulder horizontal abduction blue band 2x10 with transverse abdominis activation  Bridges with pallof blue band 2x10  each Hooklying chest press 5# with opposite side held pressure at hand/knee 2x10 each  11/15/24: Manual: Pt in quad - skilled PT providing hands on manual at thoracic and lumbar paraspinals for decreased tension and pull at DRA separation. Also manual with pt in quad lateral trunk flexion with PT providing manual at  contralateral side for rib openers and decreased tension with this stretch ,completed bil.  Pt in supine - diaphragm mobility at rib cage for decreased tension at DRA and improved rib mobility and trunk mobility  Pt educated on implementing tailwags, rib mobility with diaphragmatic breathing HEP (gentle overpressure at bil rib cage with  exhale).  11/22/24 Manual:  Neuromuscular re-education:  Exercises:  Therapeutic activities:   PATIENT EDUCATION:  Education details: See above Person educated: Patient Education method: Explanation, Demonstration, Tactile cues, Verbal cues, and Handouts Education comprehension: verbalized understanding  HOME EXERCISE PROGRAM: [id number]  ASSESSMENT:  CLINICAL IMPRESSION: Patient is a 28 y.o. female who was seen today for physical therapy treatment for postpartum abdominal weakness. Pt tolerated well today, reported feeling much better at end of session I had no idea how tight that was and how much that was pulling me. Also demonstrated improved activation of abdominals compared to start of session.  she will continue to benefit from skilled PT intervention in order to improve core strength, improve quality of life, and progress functional strengthening program.   OBJECTIVE IMPAIRMENTS: decreased activity tolerance, decreased coordination, decreased endurance, decreased mobility, decreased ROM, decreased strength, increased fascial restrictions, increased muscle spasms, impaired flexibility, impaired tone, improper body mechanics, postural dysfunction, and pain.   ACTIVITY LIMITATIONS: lifting, bending, standing, squatting, and walking  PARTICIPATION LIMITATIONS: interpersonal relationship, community activity, and exercise, caring for child  PERSONAL FACTORS: 1 comorbidity: medical history are also affecting patient's functional outcome.   REHAB POTENTIAL: Good  CLINICAL DECISION MAKING: Stable/uncomplicated  EVALUATION COMPLEXITY: Low   GOALS: Goals reviewed with patient? Yes  SHORT TERM GOALS: Target date: 08/31/2024   Pt will be independent with HEP in order to improve activity tolerance.   Baseline: Goal status: MET  2.  Pt will be independent with use of squatty potty, relaxed toileting mechanics, and improved bowel movement techniques in order to increase ease  of bowel movements and complete evacuation.    Baseline: incomplete emptying and 1-2 bowel movements a week with strianing Goal status: MET  3.  Patient will report 25% improve in pelvic pain in order to increase activity tolerance.    Baseline: 3/10 Goal status: MET  4.  Pt will be able to walk 1.5 miles without pelvic pain.  Baseline: walking 1 mile with pain Goal status: MET    LONG TERM GOALS: Target date: 01/18/2025  Pt will be independent with advanced HEP in order to improve activity tolerance.   Baseline:  Goal status: on going  2.  Patient will report 75% improve in pelvic pain in order to increase activity tolerance.   Baseline: 3/10 Goal status: on going  3.  Patient will be able to walk for 3 miles without pelvic pain.  Baseline: 1 mile with pain Goal status: on going - 2 miles mild soreness  4.  Patient will be able to perform single leg stance with good pelvic stability in order to demonstrate appropriate pelvic floor muscle and transversus abdominus strength and coordination in order to decrease pelvic pain.   Baseline: pelvic drop Goal status: on going  5.  Pt will be able to perform active straight leg bil in order to demonstrate appropriate pelvic stability that will allow her to care for her son without difficulty.  Baseline: cannot lift either LE without support Goal status: on going    PLAN:  PT FREQUENCY: 1-2x/week  PT DURATION: 12 visits    PLANNED INTERVENTIONS: 97164- PT Re-evaluation, 97110-Therapeutic exercises, 97530- Therapeutic activity,  02887- Neuromuscular re-education, [phone]- Self Care, 02859- Manual therapy, [id number]- Gait training, [phone]- Aquatic Therapy, [phone]- Electrical stimulation (unattended), [phone]- Traction (mechanical), [id number]- Ionotophoresis 4mg /ml Dexamethasone , 20560 (1-2 muscles), 20561 (3+ muscles)- Dry Needling, Patient/Family education, Balance training, Taping, Joint mobilization, Joint manipulation, Spinal manipulation,  Spinal mobilization, Scar mobilization, Vestibular training, Cryotherapy, Moist heat, and Biofeedback  PLAN FOR NEXT SESSION: core strengthening, progress to more standing, single leg, manual at back and abdomen     Josette Mares, PT, DPT11/26/259:35 AM [facility] [address], [address] [place], [place] [address] Phone # [phone] Fax [phone]

## 2024-11-29 ENCOUNTER — Ambulatory Visit: Admitting: Physical Therapy

## 2024-12-13 ENCOUNTER — Ambulatory Visit: Admitting: Physical Therapy

## 2024-12-15 ENCOUNTER — Ambulatory Visit: Payer: Self-pay | Admitting: Surgery

## 2024-12-15 NOTE — H&P (Signed)
 "   Jane Padilla [id number]   Referring Provider:  Vannie Cornell SAUNDERS, CNM   Subjective   Chief Complaint: New Consultation     History of Present Illness:    28 year old woman with history of palpitations, anemia, IBS-C, rosacea, migraine, sickle cell trait, who is 5 months status post C-section since for evaluation of an umbilical hernia.  She noticed this while she was pregnant.  It is somewhat uncomfortable when anything contacts the area and she notes that it protrudes the size of a golf ball throughout the day when she is up and moving around.  No obstructive symptoms.  She has a history of diagnostic laparoscopy for ovarian ?torsion vs abscess    Review of Systems: A complete review of systems was obtained from the patient.  I have reviewed this information and discussed as appropriate with the patient.  See HPI as well for other ROS.   Medical History: History reviewed. No pertinent past medical history.  There is no problem list on file for this patient.   History reviewed. No pertinent surgical history.   No Known Allergies  No current outpatient medications on file prior to visit.   No current facility-administered medications on file prior to visit.    History reviewed. No pertinent family history.   Social History   Tobacco Use  Smoking Status Never  Smokeless Tobacco Never     Social History   Socioeconomic History   Marital status: Single  Tobacco Use   Smoking status: Never   Smokeless tobacco: Never  Substance and Sexual Activity   Alcohol use: Never   Drug use: Never   Social Drivers of Corporate Investment Banker Strain: Low Risk (10/05/2024)   Received from [facility]   Overall Financial Resource Strain (CARDIA)    How hard is it for you to pay for the very basics like food, housing, medical care, and heating?: Not hard at all  Food Insecurity: No Food Insecurity (11/15/2024)   Received from [facility]   Hunger Vital Sign     Within the past 12 months, you worried that your food would run out before you got the money to buy more.: Never true    Within the past 12 months, the food you bought just didn't last and you didn't have money to get more.: Never true  Transportation Needs: No Transportation Needs (11/15/2024)   Received from [facility]    In the past 12 months, has lack of transportation kept you from medical appointments or from getting medications?: No    In the past 12 months, has lack of transportation kept you from meetings, work, or from getting things needed for daily living?: No   Received from [employer]   Social Network  Housing Stability: Unknown (12/15/2024)   Housing Stability Vital Sign    Homeless in the Last Year: No    Objective:    Vitals:   12/15/24 1017  BP: 99/68  Pulse: 94  Temp: 36.7 C (98 F)  SpO2: 99%  Weight: 78.7 kg (173 lb 9.6 oz)  Height: 156.2 cm (5' 1.5)  PainSc: 0-No pain    Body mass index is 32.27 kg/m.  Gen: A&Ox3, no distress  Chest: respiratory effort is normal. Abdomen: soft, nondistended, nontender.  Partially reducible mass just superior to the umbilicus which is nontender.  Fascial defect not palpable. Neuro: no gross deficit Psych: appropriate mood and affect, normal insight/judgment intact  Skin:  warm and dry   Assessment and Plan:  Diagnoses and all orders for this visit:  Ventral hernia without obstruction or gangrene  Supraumbilical hernia with chronically incarcerated fat.  We discussed the relevant anatomy and options for repair.  I recommend an open approach and we discussed possible use of mesh depending on hernia defect size.  Discussed risks of bleeding, infection, pain, scarring, injury to intra-abdominal structures, hematoma/seroma/wound healing problems or undesired cosmetic result, risk of hernia recurrence.  Reviewed typical perioperative course and postoperative recovery timeline/activity  limitations.  Questions welcomed and answered to her satisfaction.  She wishes to proceed.   Mitzie Freund MD FACS    "

## 2024-12-15 NOTE — H&P (View-Only) (Signed)
 "   Jane Padilla [id number]   Referring Provider:  Vannie Cornell SAUNDERS, CNM   Subjective   Chief Complaint: New Consultation     History of Present Illness:    28 year old woman with history of palpitations, anemia, IBS-C, rosacea, migraine, sickle cell trait, who is 5 months status post C-section since for evaluation of an umbilical hernia.  She noticed this while she was pregnant.  It is somewhat uncomfortable when anything contacts the area and she notes that it protrudes the size of a golf ball throughout the day when she is up and moving around.  No obstructive symptoms.  She has a history of diagnostic laparoscopy for ovarian ?torsion vs abscess    Review of Systems: A complete review of systems was obtained from the patient.  I have reviewed this information and discussed as appropriate with the patient.  See HPI as well for other ROS.   Medical History: History reviewed. No pertinent past medical history.  There is no problem list on file for this patient.   History reviewed. No pertinent surgical history.   No Known Allergies  No current outpatient medications on file prior to visit.   No current facility-administered medications on file prior to visit.    History reviewed. No pertinent family history.   Social History   Tobacco Use  Smoking Status Never  Smokeless Tobacco Never     Social History   Socioeconomic History   Marital status: Single  Tobacco Use   Smoking status: Never   Smokeless tobacco: Never  Substance and Sexual Activity   Alcohol use: Never   Drug use: Never   Social Drivers of Corporate Investment Banker Strain: Low Risk (10/05/2024)   Received from [facility]   Overall Financial Resource Strain (CARDIA)    How hard is it for you to pay for the very basics like food, housing, medical care, and heating?: Not hard at all  Food Insecurity: No Food Insecurity (11/15/2024)   Received from [facility]   Hunger Vital Sign     Within the past 12 months, you worried that your food would run out before you got the money to buy more.: Never true    Within the past 12 months, the food you bought just didn't last and you didn't have money to get more.: Never true  Transportation Needs: No Transportation Needs (11/15/2024)   Received from [facility] - [facility]    In the past 12 months, has lack of transportation kept you from medical appointments or from getting medications?: No    In the past 12 months, has lack of transportation kept you from meetings, work, or from getting things needed for daily living?: No   Received from [facility]   Social Network  Housing Stability: Unknown (12/15/2024)   Housing Stability Vital Sign    Homeless in the Last Year: No    Objective:    Vitals:   12/15/24 1017  BP: 99/68  Pulse: 94  Temp: 36.7 C (98 F)  SpO2: 99%  Weight: 78.7 kg (173 lb 9.6 oz)  Height: 156.2 cm (5' 1.5)  PainSc: 0-No pain    Body mass index is 32.27 kg/m.  Gen: A&Ox3, no distress  Chest: respiratory effort is normal. Abdomen: soft, nondistended, nontender.  Partially reducible mass just superior to the umbilicus which is nontender.  Fascial defect not palpable. Neuro: no gross deficit Psych: appropriate mood and affect, normal insight/judgment intact  Skin:  warm and dry   Assessment and Plan:  Diagnoses and all orders for this visit:  Ventral hernia without obstruction or gangrene  Supraumbilical hernia with chronically incarcerated fat.  We discussed the relevant anatomy and options for repair.  I recommend an open approach and we discussed possible use of mesh depending on hernia defect size.  Discussed risks of bleeding, infection, pain, scarring, injury to intra-abdominal structures, hematoma/seroma/wound healing problems or undesired cosmetic result, risk of hernia recurrence.  Reviewed typical perioperative course and postoperative recovery timeline/activity  limitations.  Questions welcomed and answered to her satisfaction.  She wishes to proceed.   Mitzie Freund MD FACS    "

## 2024-12-15 NOTE — Progress Notes (Addendum)
 "   Jane Padilla [id number]   Referring Provider:  Vannie Cornell SAUNDERS, CNM   Subjective   Chief Complaint: New Consultation     History of Present Illness:    28 year old woman with history of palpitations, anemia, IBS-C, rosacea, migraine, sickle cell trait, who is 5 months status post C-section since for evaluation of an umbilical hernia.  She noticed this while she was pregnant.  It is somewhat uncomfortable when anything contacts the area and she notes that it protrudes the size of a golf ball throughout the day when she is up and moving around.  No obstructive symptoms.  She has a history of diagnostic laparoscopy for ovarian ?torsion vs abscess  Her baby is currently about 17 pounds.  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  I have reviewed this information and discussed as appropriate with the patient.  See HPI as well for other ROS.   Medical History: History reviewed. No pertinent past medical history.  There is no problem list on file for this patient.   History reviewed. No pertinent surgical history.   No Known Allergies  No current outpatient medications on file prior to visit.   No current facility-administered medications on file prior to visit.    History reviewed. No pertinent family history.   Social History   Tobacco Use  Smoking Status Never  Smokeless Tobacco Never     Social History   Socioeconomic History   Marital status: Single  Tobacco Use   Smoking status: Never   Smokeless tobacco: Never  Substance and Sexual Activity   Alcohol use: Never   Drug use: Never   Social Drivers of Corporate Investment Banker Strain: Low Risk (10/05/2024)   Received from [facility]   Overall Financial Resource Strain (CARDIA)    How hard is it for you to pay for the very basics like food, housing, medical care,  and heating?: Not hard at all  Food Insecurity: No Food Insecurity (11/15/2024)   Received from [facility]   Hunger Vital Sign    Within the past 12 months, you worried that your food would run out before you got the money to buy more.: Never true    Within the past 12 months, the food you bought just didn't last and you didn't have money to get more.: Never true  Transportation Needs: No Transportation Needs (11/15/2024)   Received from [facility] - Transportation    In the past 12 months, has lack of transportation kept you from medical appointments or from getting medications?: No    In the past 12 months, has lack of transportation kept you from meetings, work, or from getting things needed for daily living?: No   Received from [employer]   Social Network  Housing Stability: Unknown (12/15/2024)   Housing Stability Vital Sign    Homeless in the Last Year: No    Objective:    Vitals:   12/15/24 1017  BP: 99/68  Pulse: 94  Temp: 36.7 C (98 F)  SpO2: 99%  Weight: 78.7 kg (173 lb 9.6 oz)  Height: 156.2 cm (5' 1.5)  PainSc: 0-No pain    Body mass index is 32.27 kg/m.  Gen: A&Ox3, no distress  Chest: respiratory effort is normal. Abdomen: soft, nondistended, nontender.  Partially reducible mass just superior to the umbilicus which is nontender.  Fascial defect not palpable. Neuro: no gross deficit Psych: appropriate mood and affect, normal insight/judgment intact  Skin: warm and dry   Assessment and Plan:  Diagnoses and all orders for this visit:  Ventral hernia without obstruction or gangrene  Supraumbilical hernia with chronically incarcerated fat.  We discussed the relevant anatomy and options for repair.  I recommend an open approach and we discussed possible use of mesh depending on hernia defect size.  Discussed risks of bleeding, infection, pain, scarring, injury to intra-abdominal structures, hematoma/seroma/wound healing problems or  undesired cosmetic result, risk of hernia recurrence.  Reviewed typical perioperative course and postoperative recovery timeline/activity limitations.  Questions welcomed and answered to her satisfaction.  She wishes to proceed.   Mitzie Freund MD FACS   "

## 2024-12-18 ENCOUNTER — Ambulatory Visit: Admitting: Physical Therapy

## 2024-12-18 ENCOUNTER — Encounter: Payer: Self-pay | Admitting: Certified Nurse Midwife

## 2024-12-18 NOTE — Progress Notes (Signed)
 COVID Vaccine received:  []  No [x]  Yes Date of any COVID positive Test in last 90 days: none PCP - does not remember name but @ [facility] in [place]. Cardiologist - no  Chest x-ray -  EKG -   Stress Test -  ECHO -  Cardiac Cath -   Bowel Prep - [x]  No  []   Yes ______  Pacemaker / ICD device [x]  No []  Yes   Spinal Cord Stimulator:[x]  No []  Yes       History of Sleep Apnea? [x]  No []  Yes   CPAP used?- [x]  No []  Yes    Does the patient monitor blood sugar?          [x]  No []  Yes  []  N/A  Patient has: [x]  NO Hx DM   []  Pre-DM                 []  DM1  []   DM2 Does patient have a [employer] or Dexacom? []  No []  Yes   Fasting Blood Sugar Ranges-  Checks Blood Sugar _____ times a day  GLP1 agonist / usual dose - no GLP1 instructions:  SGLT-2 inhibitors / usual dose - no SGLT-2 instructions:   Blood Thinner / Instructions:no Aspirin Instructions:no  Comments:   Activity level: Patient is able to climb a flight of stairs without difficulty; [x]  No CP  [x]  No SOB,    Patient can perform ADLs without assistance.   Anesthesia review:   Patient denies shortness of breath, fever, cough and chest pain at PAT appointment.  Patient verbalized understanding and agreement to the Pre-Surgical Instructions that were given to them at this PAT appointment. Patient was also educated of the need to review these PAT instructions again prior to his/her surgery.I reviewed the appropriate phone numbers to call if they have any and questions or concerns.

## 2024-12-18 NOTE — Patient Instructions (Signed)
 SURGICAL WAITING ROOM VISITATION  Patients having surgery or a procedure may have no more than 2 support people in the waiting area - these visitors may rotate.    Children ages 47 and under will not be able to visit patients in [facility] under most circumstances.   Visitors with respiratory illnesses are discouraged from visiting and should remain at home.  If the patient needs to stay at the hospital during part of their recovery, the visitor guidelines for inpatient rooms apply. Pre-op nurse will coordinate an appropriate time for 1 support person to accompany patient in pre-op.  This support person may not rotate.    Please refer to the [facility] website for the visitor guidelines for Inpatients (after your surgery is over and you are in a regular room).       Your procedure is scheduled on: 12/22/24   Report to [facility] Main Entrance    Report to admitting at  6:55 AM   Call this number if you have problems the morning of surgery [phone]   Do not eat food or drink liquids:After Midnight.but may have sips of water  to take any meds.     Oral Hygiene is also important to reduce your risk of infection.                                    Remember - BRUSH YOUR TEETH THE MORNING OF SURGERY WITH YOUR REGULAR TOOTHPASTE    Stop all vitamins and herbal supplements 7 days before surgery.   Take these medicines the morning of surgery with A SIP OF WATER : none             You may not have any metal on your body including hair pins, jewelry, and body piercing             Do not wear make-up, lotions, powders, perfumes/cologne, or deodorant  Do not wear nail polish including gel and S&S, artificial/acrylic nails, or any other type of covering on natural nails including finger and toenails. If you have artificial nails, gel coating, etc. that needs to be removed by a nail salon please have this removed prior to surgery or surgery may need to be canceled/  delayed if the surgeon/ anesthesia feels like they are unable to be safely monitored.   Do not shave  48 hours prior to surgery.    Do not bring valuables to the hospital. [facility] IS NOT             RESPONSIBLE   FOR VALUABLES.   Contacts, glasses, dentures or bridgework may not be worn into surgery.  DO NOT BRING YOUR HOME MEDICATIONS TO THE HOSPITAL. PHARMACY WILL DISPENSE MEDICATIONS LISTED ON YOUR MEDICATION LIST TO YOU DURING YOUR ADMISSION IN THE HOSPITAL!    Patients discharged on the day of surgery will not be allowed to drive home.  Someone NEEDS to stay with you for the first 24 hours after anesthesia.   Special Instructions: Bring a copy of your healthcare power of attorney and living will documents the day of surgery if you haven't scanned them before.              Please read over the following fact sheets you were given: IF YOU HAVE QUESTIONS ABOUT YOUR PRE-OP INSTRUCTIONS PLEASE [phone] Jane Padilla   If you received a COVID test during your pre-op visit  it  is requested that you wear a mask when out in public, stay away from anyone that may not be feeling well and notify your surgeon if you develop symptoms. If you test positive for Covid or have been in contact with anyone that has tested positive in the last 10 days please notify you surgeon.    [facility] - Preparing for Surgery Before surgery, you can play an important role.  Because skin is not sterile, your skin needs to be as free of germs as possible.  You can reduce the number of germs on your skin by washing with CHG (chlorahexidine gluconate) soap before surgery.  CHG is an antiseptic cleaner which kills germs and bonds with the skin to continue killing germs even after washing. Please DO NOT use if you have an allergy to CHG or antibacterial soaps.  If your skin becomes reddened/irritated stop using the CHG and inform your nurse when you arrive at Short Stay. Do not shave (including legs and underarms) for  at least 48 hours prior to the first CHG shower.  You may shave your face/neck.  Please follow these instructions carefully:  1.  Shower with CHG Soap the night before surgery ONLY (DO NOT USE THE SOAP THE MORNING OF SURGERY).  2.  If you choose to wash your hair, wash your hair first as usual with your normal  shampoo.  3.  After you shampoo, rinse your hair and body thoroughly to remove the shampoo.                             4.  Use CHG as you would any other liquid soap.  You can apply chg directly to the skin and wash.  Gently with a scrungie or clean washcloth.  5.  Apply the CHG Soap to your body ONLY FROM THE NECK DOWN.   Do   not use on face/ open                           Wound or open sores. Avoid contact with eyes, ears mouth and   genitals (private parts).                       Wash face,  Genitals (private parts) with your normal soap.             6.  Wash thoroughly, paying special attention to the area where your    surgery  will be performed.  7.  Thoroughly rinse your body with warm water  from the neck down.  8.  DO NOT shower/wash with your normal soap after using and rinsing off the CHG Soap.                9.  Pat yourself dry with a clean towel.            10.  Wear clean pajamas.            11.  Place clean sheets on your bed the night of your first shower and do not  sleep with pets. Day of Surgery : Do not apply any CHG, lotions/deodorants the morning of surgery.  Please wear clean clothes to the hospital/surgery center.  FAILURE TO FOLLOW THESE INSTRUCTIONS MAY RESULT IN THE CANCELLATION OF YOUR SURGERY  PATIENT SIGNATURE_________________________________  NURSE SIGNATURE__________________________________  ________________________________________________________________________

## 2024-12-19 ENCOUNTER — Encounter (HOSPITAL_COMMUNITY)
Admission: RE | Admit: 2024-12-19 | Discharge: 2024-12-19 | Disposition: A | Discharge status: Home / Self Care | Source: Ambulatory Visit | Attending: Surgery | Admitting: Surgery

## 2024-12-19 ENCOUNTER — Other Ambulatory Visit: Payer: Self-pay

## 2024-12-19 ENCOUNTER — Encounter (HOSPITAL_COMMUNITY): Payer: Self-pay

## 2024-12-19 VITALS — BP 104/73 | HR 70 | Temp 98.2°F | Resp 16 | Ht 61.5 in | Wt 170.0 lb

## 2024-12-19 DIAGNOSIS — Z01818 Encounter for other preprocedural examination: Secondary | ICD-10-CM

## 2024-12-19 DIAGNOSIS — Z01812 Encounter for preprocedural laboratory examination: Secondary | ICD-10-CM | POA: Insufficient documentation

## 2024-12-19 HISTORY — DX: Anxiety disorder, unspecified: F41.9

## 2024-12-19 LAB — CBC
HCT: 37.3 % (ref 36.0–46.0)
Hemoglobin: 12.4 g/dL (ref 12.0–15.0)
MCH: 28.1 pg (ref 26.0–34.0)
MCHC: 33.2 g/dL (ref 30.0–36.0)
MCV: 84.4 fL (ref 80.0–100.0)
Platelets: 332 K/uL (ref 150–400)
RBC: 4.42 MIL/uL (ref 3.87–5.11)
RDW: 14.1 % (ref 11.5–15.5)
WBC: 5.1 K/uL (ref 4.0–10.5)
nRBC: 0 % (ref 0.0–0.2)

## 2024-12-22 ENCOUNTER — Ambulatory Visit (HOSPITAL_COMMUNITY): Admitting: Anesthesiology

## 2024-12-22 ENCOUNTER — Ambulatory Visit (HOSPITAL_COMMUNITY): Payer: Self-pay | Admitting: Medical

## 2024-12-22 ENCOUNTER — Encounter (HOSPITAL_COMMUNITY): Payer: Self-pay | Admitting: Surgery

## 2024-12-22 ENCOUNTER — Encounter (HOSPITAL_COMMUNITY)
Admission: RE | Disposition: A | Discharge status: Home / Self Care | Payer: Self-pay | Source: Home / Self Care | Attending: Surgery

## 2024-12-22 ENCOUNTER — Ambulatory Visit (HOSPITAL_COMMUNITY)
Admission: RE | Admit: 2024-12-22 | Discharge: 2024-12-22 | Disposition: A | Discharge status: Home / Self Care | Attending: Surgery | Admitting: Surgery

## 2024-12-22 DIAGNOSIS — G43909 Migraine, unspecified, not intractable, without status migrainosus: Secondary | ICD-10-CM | POA: Insufficient documentation

## 2024-12-22 DIAGNOSIS — K436 Other and unspecified ventral hernia with obstruction, without gangrene: Secondary | ICD-10-CM | POA: Diagnosis present

## 2024-12-22 DIAGNOSIS — D573 Sickle-cell trait: Secondary | ICD-10-CM | POA: Insufficient documentation

## 2024-12-22 HISTORY — PX: UMBILICAL HERNIA REPAIR: SHX196

## 2024-12-22 LAB — POCT PREGNANCY, URINE: Preg Test, Ur: NEGATIVE

## 2024-12-22 SURGERY — REPAIR, HERNIA, UMBILICAL, ADULT
Anesthesia: General | Site: Abdomen

## 2024-12-22 MED ORDER — DROPERIDOL 2.5 MG/ML IJ SOLN: 0.6250 mg | Freq: Once | INTRAMUSCULAR | Status: DC | PRN

## 2024-12-22 MED ORDER — CEFAZOLIN SODIUM-DEXTROSE 2-4 GM/100ML-% IV SOLN
2.0000 g | INTRAVENOUS | Status: AC
  Administered 2024-12-22: 2 g via INTRAVENOUS
  Filled 2024-12-22: qty 100

## 2024-12-22 MED ORDER — BUPIVACAINE-EPINEPHRINE 0.25% -1:200000 IJ SOLN
INTRAMUSCULAR | Status: DC | PRN
  Administered 2024-12-22: 30 mL

## 2024-12-22 MED ORDER — 0.9 % SODIUM CHLORIDE (POUR BTL) OPTIME
TOPICAL | Status: DC | PRN
  Administered 2024-12-22: 1000 mL

## 2024-12-22 MED ORDER — SCOPOLAMINE 1 MG/3DAYS TD PT72: 1.0000 | MEDICATED_PATCH | TRANSDERMAL | Status: DC

## 2024-12-22 MED ORDER — SCOPOLAMINE 1 MG/3DAYS TD PT72
MEDICATED_PATCH | TRANSDERMAL | Status: AC
  Administered 2024-12-22: 1 mg via TRANSDERMAL
  Filled 2024-12-22: qty 1

## 2024-12-22 MED ORDER — DOCUSATE SODIUM 100 MG PO CAPS: 100.0000 mg | ORAL_CAPSULE | Freq: Two times a day (BID) | ORAL | 0 refills | Status: AC

## 2024-12-22 MED ORDER — ACETAMINOPHEN 10 MG/ML IV SOLN: 1000.0000 mg | Freq: Once | INTRAVENOUS | Status: DC | PRN

## 2024-12-22 MED ORDER — ONDANSETRON HCL 4 MG/2ML IJ SOLN
INTRAMUSCULAR | Status: DC | PRN
  Administered 2024-12-22: 4 mg via INTRAVENOUS

## 2024-12-22 MED ORDER — CHLORHEXIDINE GLUCONATE 0.12 % MT SOLN
15.0000 mL | Freq: Once | OROMUCOSAL | Status: AC
  Administered 2024-12-22: 15 mL via OROMUCOSAL

## 2024-12-22 MED ORDER — ORAL CARE MOUTH RINSE: 15.0000 mL | Freq: Once | OROMUCOSAL | Status: AC

## 2024-12-22 MED ORDER — PROPOFOL 10 MG/ML IV BOLUS
INTRAVENOUS | Status: DC | PRN
  Administered 2024-12-22: 150 mg via INTRAVENOUS

## 2024-12-22 MED ORDER — FENTANYL CITRATE (PF) 100 MCG/2ML IJ SOLN
INTRAMUSCULAR | Status: AC
  Filled 2024-12-22: qty 2

## 2024-12-22 MED ORDER — MIDAZOLAM HCL 5 MG/5ML IJ SOLN
INTRAMUSCULAR | Status: DC | PRN
  Administered 2024-12-22: 2 mg via INTRAVENOUS

## 2024-12-22 MED ORDER — LACTATED RINGERS IV SOLN: INTRAVENOUS | Status: DC | PRN

## 2024-12-22 MED ORDER — CHLORHEXIDINE GLUCONATE 4 % EX SOLN: 60.0000 mL | Freq: Once | CUTANEOUS | Status: DC

## 2024-12-22 MED ORDER — LIDOCAINE HCL (PF) 2 % IJ SOLN
INTRAMUSCULAR | Status: AC
  Filled 2024-12-22: qty 10

## 2024-12-22 MED ORDER — ROCURONIUM BROMIDE 10 MG/ML (PF) SYRINGE
PREFILLED_SYRINGE | INTRAVENOUS | Status: DC | PRN
  Administered 2024-12-22: 50 mg via INTRAVENOUS

## 2024-12-22 MED ORDER — ONDANSETRON HCL 4 MG/2ML IJ SOLN
INTRAMUSCULAR | Status: AC
  Filled 2024-12-22: qty 4

## 2024-12-22 MED ORDER — OXYCODONE HCL 5 MG/5ML PO SOLN: 5.0000 mg | Freq: Once | ORAL | Status: AC | PRN

## 2024-12-22 MED ORDER — MIDAZOLAM HCL 2 MG/2ML IJ SOLN
INTRAMUSCULAR | Status: AC
  Filled 2024-12-22: qty 2

## 2024-12-22 MED ORDER — LIDOCAINE 2% (20 MG/ML) 5 ML SYRINGE
INTRAMUSCULAR | Status: DC | PRN
  Administered 2024-12-22: 80 mg via INTRAVENOUS

## 2024-12-22 MED ORDER — OXYCODONE HCL 5 MG PO TABS
ORAL_TABLET | ORAL | Status: AC
  Filled 2024-12-22: qty 1

## 2024-12-22 MED ORDER — PROPOFOL 10 MG/ML IV BOLUS
INTRAVENOUS | Status: AC
  Filled 2024-12-22: qty 20

## 2024-12-22 MED ORDER — LACTATED RINGERS IV SOLN: INTRAVENOUS | Status: DC

## 2024-12-22 MED ORDER — ACETAMINOPHEN 500 MG PO TABS
1000.0000 mg | ORAL_TABLET | ORAL | Status: AC
  Administered 2024-12-22: 1000 mg via ORAL
  Filled 2024-12-22: qty 2

## 2024-12-22 MED ORDER — DEXAMETHASONE SOD PHOSPHATE PF 10 MG/ML IJ SOLN
INTRAMUSCULAR | Status: DC | PRN
  Administered 2024-12-22: 10 mg via INTRAVENOUS

## 2024-12-22 MED ORDER — ACETAMINOPHEN 325 MG PO TABS
650.0000 mg | ORAL_TABLET | ORAL | Status: DC | PRN
  Administered 2024-12-22: 650 mg via ORAL

## 2024-12-22 MED ORDER — SUGAMMADEX SODIUM 200 MG/2ML IV SOLN
INTRAVENOUS | Status: AC
  Filled 2024-12-22: qty 2

## 2024-12-22 MED ORDER — OXYCODONE HCL 5 MG PO TABS: 5.0000 mg | ORAL_TABLET | Freq: Three times a day (TID) | ORAL | 0 refills | Status: AC | PRN

## 2024-12-22 MED ORDER — FENTANYL CITRATE (PF) 50 MCG/ML IJ SOSY: 25.0000 ug | PREFILLED_SYRINGE | INTRAMUSCULAR | Status: DC | PRN

## 2024-12-22 MED ORDER — FENTANYL CITRATE (PF) 100 MCG/2ML IJ SOLN
INTRAMUSCULAR | Status: DC | PRN
  Administered 2024-12-22: 100 ug via INTRAVENOUS

## 2024-12-22 MED ORDER — SUGAMMADEX SODIUM 200 MG/2ML IV SOLN
INTRAVENOUS | Status: DC | PRN
  Administered 2024-12-22: 200 mg via INTRAVENOUS

## 2024-12-22 MED ORDER — OXYCODONE HCL 5 MG PO TABS
5.0000 mg | ORAL_TABLET | Freq: Once | ORAL | Status: AC | PRN
  Administered 2024-12-22: 5 mg via ORAL

## 2024-12-22 MED ORDER — ACETAMINOPHEN 325 MG PO TABS
ORAL_TABLET | ORAL | Status: AC
  Filled 2024-12-22: qty 2

## 2024-12-22 MED ORDER — GABAPENTIN 300 MG PO CAPS
300.0000 mg | ORAL_CAPSULE | ORAL | Status: AC
  Administered 2024-12-22: 300 mg via ORAL
  Filled 2024-12-22: qty 1

## 2024-12-22 MED ORDER — OXYCODONE HCL 5 MG PO TABS
5.0000 mg | ORAL_TABLET | ORAL | Status: DC | PRN
  Administered 2024-12-22: 5 mg via ORAL

## 2024-12-22 MED ORDER — ACETAMINOPHEN 650 MG RE SUPP: 650.0000 mg | RECTAL | Status: DC | PRN

## 2024-12-22 MED ORDER — BUPIVACAINE-EPINEPHRINE (PF) 0.25% -1:200000 IJ SOLN
INTRAMUSCULAR | Status: AC
  Filled 2024-12-22: qty 30

## 2024-12-22 SURGICAL SUPPLY — 28 items
BAG COUNTER SPONGE SURGICOUNT (BAG) IMPLANT
BENZOIN TINCTURE PRP APPL 2/3 (GAUZE/BANDAGES/DRESSINGS) IMPLANT
CHLORAPREP W/TINT 26 (MISCELLANEOUS) ×1 IMPLANT
CLSR STERI-STRIP ANTIMIC 1/2X4 (GAUZE/BANDAGES/DRESSINGS) IMPLANT
COVER BACK TABLE 60X90IN (DRAPES) IMPLANT
COVER SURGICAL LIGHT HANDLE (MISCELLANEOUS) ×1 IMPLANT
DRAPE LAPAROSCOPIC ABDOMINAL (DRAPES) ×1 IMPLANT
DRAPE UTILITY XL STRL (DRAPES) IMPLANT
ELECT PENCIL ROCKER SW 15FT (MISCELLANEOUS) IMPLANT
ELECT REM PT RETURN 15FT ADLT (MISCELLANEOUS) ×1 IMPLANT
GAUZE SPONGE 4X4 12PLY STRL (GAUZE/BANDAGES/DRESSINGS) IMPLANT
GLOVE BIO SURGEON STRL SZ 6 (GLOVE) ×1 IMPLANT
GLOVE INDICATOR 6.5 STRL GRN (GLOVE) ×1 IMPLANT
GOWN STRL REUS W/ TWL LRG LVL3 (GOWN DISPOSABLE) ×1 IMPLANT
KIT BASIN OR (CUSTOM PROCEDURE TRAY) ×1 IMPLANT
KIT TURNOVER KIT A (KITS) ×1 IMPLANT
NDL HYPO 22X1.5 SAFETY MO (MISCELLANEOUS) IMPLANT
NEEDLE HYPO 22X1.5 SAFETY MO (MISCELLANEOUS) IMPLANT
PACK GENERAL/GYN (CUSTOM PROCEDURE TRAY) ×1 IMPLANT
SPIKE FLUID TRANSFER (MISCELLANEOUS) ×1 IMPLANT
STRIP CLOSURE SKIN 1/2X4 (GAUZE/BANDAGES/DRESSINGS) IMPLANT
SUT ETHIBOND 0 MO6 C/R (SUTURE) IMPLANT
SUT MNCRL AB 4-0 PS2 18 (SUTURE) ×1 IMPLANT
SUT VIC AB 3-0 SH 27XBRD (SUTURE) IMPLANT
SYR CONTROL 10ML LL (SYRINGE) IMPLANT
TOWEL OR DSP ST BLU DLX 10/PK (DISPOSABLE) ×1 IMPLANT
TRAY LAPAROSCOPIC (CUSTOM PROCEDURE TRAY) IMPLANT
YANKAUER SUCT BULB TIP 10FT TU (MISCELLANEOUS) IMPLANT

## 2024-12-22 NOTE — Op Note (Signed)
 Operative Note  Jane Padilla  [id number]  [id number]  12/22/2024   Surgeon: Mitzie Freund MD FACS   Procedure performed: Open repair of chronically incarcerated epigastric hernia, fascial defect 1 cm   Preop diagnosis: Chronically incarcerated epigastric hernia, fascial defect not palpable Post-op diagnosis/intraop findings: Same, containing preperitoneal fat; fascial defect 1 cm after dissection   Specimens: No Retained items: No EBL: Minimal cc Complications: none   Description of procedure: After confirming informed consent the patient was taken to the operating room and placed supine on operating room table where general endotracheal anesthesia was initiated, preoperative antibiotics were administered, SCDs applied, and a formal timeout was performed.  The abdomen was prepped and draped in usual sterile fashion.  After infiltration with local, a small radical supraumbilical incision was made over the region of the palpable mass corresponding to a chronically incarcerated epigastric hernia.  The soft tissues were dissected with cautery until the hernia sac was encountered.  This was circumferentially dissected down to the level of the fascia where the hernia sac was excised.  The sac was noted to consist of chronically incarcerated preperitoneal fat and falciform ligament.  The falciform was ligated with a 3-0 Vicryl tie to complete the excision of the hernia sac.  The fascial defect was then circumferentially dissected and measured 1 cm in diameter.  This was closed transversely with simple interrupted 0 Ethibonds.  We were able to visualize the peritoneal cavity throughout the closure and ensured that the bowel was well away from the fascial closure.  After completion, additional local was infiltrated in the fascia and soft tissue surrounding the repair.  Hemostasis was assured within the wound.  The deep soft tissues were reapproximated with simple interrupted 3-0 Vicryl's and the  skin was closed with running subcuticular 4-0 Monocryl.  Benzoin, Steri-Strips, and a light pressure dressing of gauze and tape were then applied.  The patient was then awakened, extubated and taken to PACU in stable condition.    All counts were correct at the completion of the case.

## 2024-12-22 NOTE — Discharge Instructions (Signed)
 HERNIA REPAIR: POST OP INSTRUCTIONS   EAT Gradually transition to your usual diet over the next few days after discharge.  WALK Walk an hour a day (cumulative- not all at once).  Control your pain to do that.    CONTROL PAIN Control pain so that you can walk, sleep, tolerate sneezing/coughing, and go up/down stairs.  HAVE A BOWEL MOVEMENT DAILY Keep your bowels regular to avoid problems.  OK to try a laxative to override constipation.  OK to use an antidiarrheal to slow down diarrhea.  Call if not better after 2 tries  CALL IF YOU HAVE PROBLEMS/CONCERNS Call if you are still struggling despite following these instructions. Call if you have concerns not answered by these instructions  ######################################################################    DIET: Follow a light bland diet & liquids the first 24 hours after arrival home, such as soup, liquids, starches, etc.  Be sure to drink plenty of fluids.  Quickly advance to a usual solid diet within a few days.  Avoid fast food or heavy meals initially as you are more likely to get nauseated or have irregular bowels.  Take your usually prescribed home medications unless otherwise directed.  PAIN CONTROL: Pain is best controlled by a usual combination of three different methods TOGETHER: Ice/Heat Over the counter pain medication Prescription pain medication Most patients will experience some swelling and bruising around the surgical site.  Ice packs or heating pads (30-60 minutes up to 6 times a day) will help. Use ice for the first few days to help decrease swelling and bruising, then switch to heat to help relax tight/sore spots and speed recovery.  Some people prefer to use ice alone, heat alone, alternating between ice & heat.  Experiment to what works for you.  Swelling and bruising can take several weeks to resolve.   It is helpful to take an over-the-counter pain medication regularly for the first days: Naproxen (Aleve,  etc)  Two 220mg  tabs twice a day OR Ibuprofen  (Advil , etc) Three 200mg  tabs four times a day (every meal & bedtime) AND Acetaminophen  (Tylenol , etc) 325-650mg  four times a day (every meal & bedtime) A  prescription for pain medication should be given to you upon discharge.  Take your pain medication as prescribed, IF NEEDED.  If you are having problems/concerns with the prescription medicine (does not control pain, nausea, vomiting, rash, itching, etc), please call us  [phone] to see if we need to switch you to a different pain medicine that will work better for you and/or control your side effect better. If you need a refill on your pain medication, please contact your pharmacy.  They will contact our office to request authorization. Prescriptions will not be filled after 5 pm or on week-ends.  Avoid getting constipated.  Between the surgery and the pain medications, it is common to experience some constipation.  Increasing fluid intake and taking a fiber supplement (such as Metamucil, Citrucel, FiberCon, MiraLax , etc) 1-2 times a day regularly will usually help prevent this problem from occurring.  A mild laxative (prune juice, Milk of Magnesia, MiraLax , etc) should be taken according to package directions if there are no bowel movements after 48 hours.    Wash / shower every day, starting 2 days after surgery.  You may shower over the steri strips are waterproof.  No rubbing, scrubbing, lotions or ointments to incision. Do not soak or submerge incision.   Remove your outer bandage (gauze and tape) 2 days after surgery. Steri strips (small white  tapes directly on incision) will peel off after 1-2 weeks. You may leave the incision open to air.  You may replace a dressing/Band-Aid to cover an incision for comfort if you wish.  Continue to shower over incision(s) after the dressing is off.  ACTIVITIES as tolerated:   You may resume regular (light) daily activities beginning the next day--such as  daily self-care, walking, climbing stairs--gradually increasing activities as tolerated.  Control your pain so that you can walk an hour a day.  If you can walk 30 minutes without difficulty, it is safe to try more intense activity such as jogging, treadmill, bicycling, low-impact aerobics, swimming, etc. Refrain from the most intensive and strenuous activity such as sit-ups, heavy lifting, contact sports, etc  Refrain from any heavy lifting or straining until 6 weeks after surgery.   DO NOT PUSH THROUGH PAIN.  Let pain be your guide: If it hurts to do something, don't do it.  Pain is your body warning you to avoid that activity for another week until the pain goes down. You may drive when you are no longer taking prescription pain medication, you can comfortably wear a seatbelt, and you can safely maneuver your car and apply brakes. You may have sexual intercourse when it is comfortable.   FOLLOW UP in our office Please call [facility] at [phone] to set up an appointment to see your surgeon in the office for a follow-up appointment approximately 2-3 weeks after your surgery. Make sure that you call for this appointment the day you arrive home to insure a convenient appointment time.  9.  If you have disability of FMLA / Family leave forms, please bring the forms to the office for processing.  (do not give to your surgeon).  WHEN TO CALL US  ([phone]) [phone]: Poor pain control Reactions / problems with new medications (rash/itching, nausea, etc)  Fever over 101.5 F (38.5 C) Inability to urinate Nausea and/or vomiting Worsening swelling or bruising Continued bleeding from incision. Increased pain, redness, or drainage from the incision   The clinic staff is available to answer your questions during regular business hours (8:30am-5pm).  Please dont hesitate to call and ask to speak to one of our nurses for clinical concerns.   If you have a medical emergency, go to the nearest emergency room or  call 911.  A surgeon from [facility] is always on call at the hospitals in [facility], [place] [address], [address], [place], [place]  [address] ?  [address] [address], [place], [place]   [address] MAIN: [phone] ? TOLL FREE: [phone] ? FAX: [phone] [email or web address]

## 2024-12-22 NOTE — Anesthesia Postprocedure Evaluation (Signed)
"   Anesthesia Post Note  Patient: Jane Padilla  Procedure(s) Performed: REPAIR, HERNIA, UMBILICAL, ADULT (Abdomen)     Patient location during evaluation: PACU Anesthesia Type: General Level of consciousness: awake and alert Pain management: pain level controlled Vital Signs Assessment: post-procedure vital signs reviewed and stable Respiratory status: spontaneous breathing, nonlabored ventilation, respiratory function stable and patient connected to nasal cannula oxygen Cardiovascular status: blood pressure returned to baseline and stable Postop Assessment: no apparent nausea or vomiting Anesthetic complications: no   No notable events documented.  Last Vitals:  Vitals:   12/22/24 0930 12/22/24 0945  BP: 114/80 115/82  Pulse: (!) 57 64  Resp: 20 18  Temp:  36.4 C  SpO2: 98% 99%    Last Pain:  Vitals:   12/22/24 0945  TempSrc:   PainSc: 2                  Thom JONELLE Peoples      "

## 2024-12-22 NOTE — Anesthesia Preprocedure Evaluation (Signed)
"                                    Anesthesia Evaluation  Patient identified by MRN, date of birth, ID band Patient awake    Reviewed: Allergy & Precautions, H&P , NPO status , Patient's Chart, lab work & pertinent test results  History of Anesthesia Complications Negative for: history of anesthetic complications  Airway Mallampati: II  TM Distance: >3 FB Neck ROM: Full    Dental no notable dental hx.    Pulmonary neg pulmonary ROS   Pulmonary exam normal breath sounds clear to auscultation       Cardiovascular (-) hypertension(-) angina negative cardio ROS Normal cardiovascular exam Rhythm:Regular Rate:Normal     Neuro/Psych  Headaches, neg Seizures PSYCHIATRIC DISORDERS Anxiety        GI/Hepatic Neg liver ROS,,,HERNIA   Endo/Other  negative endocrine ROS    Renal/GU negative Renal ROS  negative genitourinary   Musculoskeletal negative musculoskeletal ROS (+)    Abdominal   Peds negative pediatric ROS (+)  Hematology negative hematology ROS (+)   Anesthesia Other Findings   Reproductive/Obstetrics S/p c-section 7/10                              Anesthesia Physical Anesthesia Plan  ASA: 2  Anesthesia Plan: General   Post-op Pain Management: Tylenol  PO (pre-op)*   Induction: Intravenous  PONV Risk Score and Plan: 3 and Ondansetron , Dexamethasone , Midazolam  and Treatment may vary due to age or medical condition  Airway Management Planned: Oral ETT  Additional Equipment: None  Intra-op Plan:   Post-operative Plan: Extubation in OR  Informed Consent: I have reviewed the patients History and Physical, chart, labs and discussed the procedure including the risks, benefits and alternatives for the proposed anesthesia with the patient or authorized representative who has indicated his/her understanding and acceptance.     Dental advisory given  Plan Discussed with: CRNA  Anesthesia Plan Comments:           Anesthesia Quick Evaluation  "

## 2024-12-22 NOTE — Transfer of Care (Signed)
 Immediate Anesthesia Transfer of Care Note  Patient: Jane Padilla  Procedure(s) Performed: Procedures: REPAIR, HERNIA, UMBILICAL, ADULT (N/A)  Patient Location: PACU  Anesthesia Type:General  Level of Consciousness:  sedated, patient cooperative and responds to stimulation  Airway & Oxygen Therapy:Patient Spontanous Breathing and Patient connected to face mask oxgen  Post-op Assessment:  Report given to PACU RN and Post -op Vital signs reviewed and stable  Post vital signs:  Reviewed and stable  Last Vitals:  Vitals:   12/22/24 0536  BP: (!) 120/56  Pulse: 77  Resp: 17  Temp: 37.1 C  SpO2: 97%    Complications: No apparent anesthesia complications

## 2024-12-22 NOTE — Anesthesia Procedure Notes (Signed)
 Procedure Name: Intubation Date/Time: 12/22/2024 7:41 AM  Performed by: Vincenzo Show, CRNAPre-anesthesia Checklist: Patient identified, Emergency Drugs available, Suction available, Patient being monitored and Timeout performed Patient Re-evaluated:Patient Re-evaluated prior to induction Oxygen Delivery Method: Circle system utilized Preoxygenation: Pre-oxygenation with 100% oxygen Induction Type: IV induction Ventilation: Mask ventilation without difficulty Laryngoscope Size: Mac and 3 Grade View: Grade I Tube type: Oral Tube size: 7.0 mm Number of attempts: 1 Airway Equipment and Method: Stylet Placement Confirmation: ETT inserted through vocal cords under direct vision, positive ETCO2, CO2 detector and breath sounds checked- equal and bilateral Secured at: 21 cm Tube secured with: Tape Dental Injury: Teeth and Oropharynx as per pre-operative assessment  Comments: ATOI

## 2024-12-22 NOTE — Interval H&P Note (Signed)
 History and Physical Interval Note:  12/22/2024 7:13 AM  Jane Padilla  has presented today for surgery, with the diagnosis of HERNIA.  The various methods of treatment have been discussed with the patient and family. After consideration of risks, benefits and other options for treatment, the patient has consented to  Procedures: REPAIR, HERNIA, UMBILICAL, ADULT (N/A) as a surgical intervention.  The patient's history has been reviewed, patient examined, no change in status, stable for surgery.  I have reviewed the patient's chart and labs.  Questions were answered to the patient's satisfaction.     Kimberlyn Quiocho DELENA Freund

## 2024-12-23 ENCOUNTER — Encounter (HOSPITAL_COMMUNITY): Payer: Self-pay | Admitting: Surgery

## 2025-01-01 ENCOUNTER — Telehealth: Payer: Self-pay | Admitting: Family Medicine

## 2025-01-01 ENCOUNTER — Ambulatory Visit: Admitting: Physical Therapy

## 2025-01-01 NOTE — Telephone Encounter (Signed)
 Attempted to call patient 2x to schedule appointment with MD in our office. Was unable to reach patient so I left detailed message for patient to call back if she is still needing an appointment.
